# Patient Record
Sex: Male | Born: 1935 | Race: White | Hispanic: No | Smoking: Current some day smoker
Health system: Southern US, Community
[De-identification: ages and names within clinical notes are randomized; demographics above are authoritative.]

## PROBLEM LIST (undated history)

## (undated) DIAGNOSIS — Z95810 Presence of automatic (implantable) cardiac defibrillator: Secondary | ICD-10-CM

## (undated) DIAGNOSIS — I10 Essential (primary) hypertension: Secondary | ICD-10-CM

## (undated) DIAGNOSIS — F329 Major depressive disorder, single episode, unspecified: Secondary | ICD-10-CM

## (undated) DIAGNOSIS — G20A1 Parkinson's disease without dyskinesia, without mention of fluctuations: Secondary | ICD-10-CM

## (undated) DIAGNOSIS — Z87442 Personal history of urinary calculi: Secondary | ICD-10-CM

## (undated) DIAGNOSIS — J449 Chronic obstructive pulmonary disease, unspecified: Secondary | ICD-10-CM

## (undated) DIAGNOSIS — K219 Gastro-esophageal reflux disease without esophagitis: Secondary | ICD-10-CM

## (undated) DIAGNOSIS — I219 Acute myocardial infarction, unspecified: Secondary | ICD-10-CM

## (undated) DIAGNOSIS — R7303 Prediabetes: Secondary | ICD-10-CM

## (undated) DIAGNOSIS — G20 Parkinson's disease: Secondary | ICD-10-CM

## (undated) DIAGNOSIS — F32A Depression, unspecified: Secondary | ICD-10-CM

## (undated) DIAGNOSIS — I499 Cardiac arrhythmia, unspecified: Secondary | ICD-10-CM

## (undated) DIAGNOSIS — I2510 Atherosclerotic heart disease of native coronary artery without angina pectoris: Secondary | ICD-10-CM

## (undated) HISTORY — PX: CORONARY ARTERY BYPASS GRAFT: SHX141

---

## 1993-05-22 HISTORY — PX: OTHER SURGICAL HISTORY: SHX169

## 2005-08-15 ENCOUNTER — Other Ambulatory Visit: Payer: Self-pay

## 2005-08-15 ENCOUNTER — Ambulatory Visit: Payer: Self-pay | Admitting: Urology

## 2005-08-17 ENCOUNTER — Ambulatory Visit: Payer: Self-pay | Admitting: Urology

## 2007-12-24 ENCOUNTER — Ambulatory Visit: Payer: Self-pay | Admitting: Family Medicine

## 2010-03-17 ENCOUNTER — Ambulatory Visit: Payer: Self-pay | Admitting: Urology

## 2010-06-15 ENCOUNTER — Ambulatory Visit: Payer: Self-pay | Admitting: Family Medicine

## 2011-10-17 ENCOUNTER — Ambulatory Visit: Payer: Self-pay | Admitting: Family Medicine

## 2011-12-01 ENCOUNTER — Ambulatory Visit: Payer: Self-pay | Admitting: Emergency Medicine

## 2011-12-04 ENCOUNTER — Ambulatory Visit: Payer: Self-pay

## 2011-12-08 ENCOUNTER — Ambulatory Visit: Payer: Self-pay | Admitting: Emergency Medicine

## 2012-05-22 HISTORY — PX: OTHER SURGICAL HISTORY: SHX169

## 2012-07-01 ENCOUNTER — Ambulatory Visit: Payer: Self-pay

## 2014-08-12 ENCOUNTER — Ambulatory Visit: Payer: Self-pay | Admitting: Urology

## 2014-08-13 ENCOUNTER — Ambulatory Visit: Payer: Self-pay | Admitting: Urology

## 2014-08-20 ENCOUNTER — Ambulatory Visit
Admit: 2014-08-20 | Disposition: A | Discharge status: Home / Self Care | Payer: Self-pay | Attending: Urology | Admitting: Urology

## 2016-05-02 ENCOUNTER — Other Ambulatory Visit: Payer: Self-pay | Admitting: Family Medicine

## 2016-05-02 DIAGNOSIS — R918 Other nonspecific abnormal finding of lung field: Secondary | ICD-10-CM

## 2016-05-10 ENCOUNTER — Ambulatory Visit
Admission: RE | Admit: 2016-05-10 | Discharge: 2016-05-10 | Disposition: A | Discharge status: Home / Self Care | Payer: Medicare Other | Source: Ambulatory Visit | Attending: Family Medicine | Admitting: Family Medicine

## 2016-05-10 DIAGNOSIS — R918 Other nonspecific abnormal finding of lung field: Secondary | ICD-10-CM | POA: Diagnosis present

## 2016-05-10 DIAGNOSIS — R590 Localized enlarged lymph nodes: Secondary | ICD-10-CM | POA: Insufficient documentation

## 2016-05-10 DIAGNOSIS — K769 Liver disease, unspecified: Secondary | ICD-10-CM | POA: Diagnosis not present

## 2016-05-16 ENCOUNTER — Inpatient Hospital Stay: Payer: Medicare Other | Attending: Oncology | Admitting: Oncology

## 2016-05-16 ENCOUNTER — Encounter: Payer: Self-pay | Admitting: Oncology

## 2016-05-16 ENCOUNTER — Encounter (INDEPENDENT_AMBULATORY_CARE_PROVIDER_SITE_OTHER): Payer: Self-pay

## 2016-05-16 VITALS — BP 175/66 | HR 72 | Temp 94.9°F | Resp 18 | Ht 69.0 in | Wt 121.3 lb

## 2016-05-16 DIAGNOSIS — Z950 Presence of cardiac pacemaker: Secondary | ICD-10-CM | POA: Insufficient documentation

## 2016-05-16 DIAGNOSIS — Z7982 Long term (current) use of aspirin: Secondary | ICD-10-CM | POA: Insufficient documentation

## 2016-05-16 DIAGNOSIS — I10 Essential (primary) hypertension: Secondary | ICD-10-CM | POA: Insufficient documentation

## 2016-05-16 DIAGNOSIS — I700 Atherosclerosis of aorta: Secondary | ICD-10-CM | POA: Insufficient documentation

## 2016-05-16 DIAGNOSIS — F17210 Nicotine dependence, cigarettes, uncomplicated: Secondary | ICD-10-CM | POA: Insufficient documentation

## 2016-05-16 DIAGNOSIS — J439 Emphysema, unspecified: Secondary | ICD-10-CM | POA: Insufficient documentation

## 2016-05-16 DIAGNOSIS — Z79899 Other long term (current) drug therapy: Secondary | ICD-10-CM | POA: Insufficient documentation

## 2016-05-16 DIAGNOSIS — R918 Other nonspecific abnormal finding of lung field: Secondary | ICD-10-CM | POA: Diagnosis present

## 2016-05-16 DIAGNOSIS — R599 Enlarged lymph nodes, unspecified: Secondary | ICD-10-CM | POA: Insufficient documentation

## 2016-05-16 NOTE — Progress Notes (Signed)
[place]  [phone] [phone]) [phone]  ID: HARIHARAN KLOOS OB: 03-15-1932  MR#: [id number]  [id number]  Patient Care Team: Sofie Hartigan, MD as PCP - General (Family Medicine)  CHIEF COMPLAINT: Right upper lobe lung mass  INTERVAL HISTORY: Patient is an 82 year old male who was having a persistent cough and increased fatigue unresponsive to antibiotics and steroids. He subsequently had a CT scan that revealed a right upper lobe lung mass highly suspicious for underlying malignancy. Currently patient feels well and is asymptomatic. He has no neurologic complaints. He denies any recent fevers. He does not complain of cough or shortness of breath today. He denies any chest pain. He has no nausea, vomiting, constipation, or diarrhea. He has no urinary complaints. Patient offers no specific complaints today.  REVIEW OF SYSTEMS:   Review of Systems  Constitutional: Negative.  Negative for fever, malaise/fatigue and weight loss.  Respiratory: Negative.  Negative for cough and shortness of breath.   Cardiovascular: Negative.  Negative for chest pain and leg swelling.  Gastrointestinal: Negative.  Negative for abdominal pain.  Genitourinary: Negative.   Musculoskeletal: Negative.   Neurological: Negative.  Negative for weakness.  Psychiatric/Behavioral: Negative.  The patient is not nervous/anxious.     As per HPI. Otherwise, a complete review of systems is negative.  PAST MEDICAL HISTORY: No past medical history on file.  PAST SURGICAL HISTORY: Past Surgical History:  Procedure Laterality Date  . Difibulator placement  2014  . Triple bypass  1995    FAMILY HISTORY: Reviewed and unchanged. No reported history of malignancy or chronic disease.  ADVANCED DIRECTIVES (Y/N):  N  HEALTH MAINTENANCE: Social History  Substance Use Topics  . Smoking status: Current Some Day Smoker    Packs/day: 0.50    Years: 70.00  . Smokeless tobacco: Not on  file  . Alcohol use Not on file     Colonoscopy:  PAP:  Bone density:  Lipid panel:  Not on File  Current Outpatient Prescriptions  Medication Sig Dispense Refill  . albuterol (PROVENTIL HFA;VENTOLIN HFA) 108 (90 Base) MCG/ACT inhaler Inhale into the lungs.    . carbidopa-levodopa (SINEMET IR) 25-100 MG tablet Take 1 tablet by mouth 3 (three) times daily.    . citalopram (CELEXA) 10 MG tablet Take by mouth.    . Clobetasol Prop Emollient Base 0.05 % emollient cream Apply topically 2 (two) times daily.    . fluticasone (FLONASE) 50 MCG/ACT nasal spray Place into both nostrils daily.    . metoprolol succinate (TOPROL-XL) 50 MG 24 hr tablet TAKE 1 TABLET BY MOUTH TWICE DAILY    . montelukast (SINGULAIR) 10 MG tablet TAKE 1 TABLET(10 MG) BY MOUTH EVERY EVENING    . ranitidine (ZANTAC) 150 MG tablet TAKE 1 TABLET(150 MG) BY MOUTH EVERY DAY    . simvastatin (ZOCOR) 20 MG tablet TAKE 1 TABLET(20 MG) BY MOUTH EVERY NIGHT    . aspirin EC 81 MG tablet Take by mouth.    . carboxymethylcellulose (REFRESH PLUS) 0.5 % SOLN Apply to eye.    Marland Kitchen lisinopril (PRINIVIL,ZESTRIL) 5 MG tablet   6   No current facility-administered medications for this visit.     OBJECTIVE: Vitals:   05/16/16 1535  BP: (!) 175/66  Pulse: 72  Resp: 18  Temp: (!) 94.9 F (34.9 C)     Body mass index is 17.91 kg/m.    ECOG FS:1 - Symptomatic but completely ambulatory  General: Well-developed, well-nourished, no acute distress. Eyes: Pink  conjunctiva, anicteric sclera. HEENT: Normocephalic, moist mucous membranes, clear oropharnyx. Lungs: Clear to auscultation bilaterally. Heart: Regular rate and rhythm. No rubs, murmurs, or gallops. Abdomen: Soft, nontender, nondistended. No organomegaly noted, normoactive bowel sounds. Musculoskeletal: No edema, cyanosis, or clubbing. Neuro: Alert, answering all questions appropriately. Cranial nerves grossly intact. Skin: No rashes or petechiae noted. Psych: Normal  affect. Lymphatics: No cervical, calvicular, axillary or inguinal LAD.   LAB RESULTS:  No results found for: NA, K, CL, CO2, GLUCOSE, BUN, CREATININE, CALCIUM, PROT, ALBUMIN, AST, ALT, ALKPHOS, BILITOT, GFRNONAA, GFRAA  No results found for: WBC, NEUTROABS, HGB, HCT, MCV, PLT   STUDIES: Ct Chest Wo Contrast  Result Date: 05/10/2016 CLINICAL DATA:  Pt has no complaints today. NKI. No hx of cancer/surg. FU xray form Dr Ellison Hughs office EXAM: CT CHEST WITHOUT CONTRAST TECHNIQUE: Multidetector CT imaging of the chest was performed following the standard protocol without IV contrast. COMPARISON:  Report only from outside chest x-ray 04/21/2016 FINDINGS: Cardiovascular: Left subclavian pacemaker extends to right ventricular apex. There is diffuse atherosclerosis of the aorta, great vessels and coronary arteries post median sternotomy. The heart size is normal. There is no pericardial effusion. Mediastinum/Nodes: There is a high right paraesophageal node measuring 2.5 x 1.6 cm on image 21. No other enlarged mediastinal, hilar or axillary lymph nodes are seen. Hilar assessment is limited by the lack of intravenous contrast. The thyroid gland, trachea and esophagus demonstrate no significant findings. Lungs/Pleura: There is no pleural effusion. Corresponding with the radiographic report is a dominant right upper lobe nodule which is lobulated, measuring 1.7 x 1.5 cm on image 49. There are several additional pulmonary nodules bilaterally, including a 6 mm right upper lobe nodule on image 50, a 7 mm lingular nodule on image 87, a 9 mm right lower lobe nodule on image 103 and a 7 mm subpleural nodule in the left upper lobe on image 22. There is mild biapical scarring. Mild to moderate underlying emphysema noted. Upper abdomen: There are numerous ill-defined low-density hepatic lesions, highly worrisome for metastatic disease. Largest lesions include a 3.8 x 2.9 cm lesion in the dome of the left lobe (image 143)  and a 2.0 cm lesion posteriorly in the right lobe on image 158. No evidence of adrenal mass. Probable bilateral renal cysts. There is aortic and branch vessel atherosclerosis. Musculoskeletal/Chest wall: There is no chest wall mass or suspicious osseous finding. Paucity of subcutaneous fat. IMPRESSION: 1. Bilateral pulmonary nodules, largest measuring 1.7 cm in the right upper lobe. 2. Multiple ill-defined hepatic lesions, worrisome for metastatic disease. 3. Enlarged high right paraesophageal lymph node, likely a nodal metastasis. 4. Findings are worrisome for stage IV malignancy, possibly from right upper lobe pulmonary primary. Given the prominent hepatic involvement, consider further imaging of the abdomen and pelvis (with contrast) or PET-CT to evaluate for an intraabdominal primary. 5. These results will be called to the ordering clinician or representative by the Radiologist Assistant, and communication documented in the PACS or zVision Dashboard. Electronically Signed   By: Richardean Sale M.D.   On: 05/10/2016 14:56    ASSESSMENT: Right upper lobe lung mass  PLAN:    1. Right upper lobe lung mass: CT scan results from May 10, 2016 reviewed independently and reported as above with bilateral pulmonary nodules including a 1.7 cm right upper lobe lung mass. Given patient's extensive smoking history this is highly suspicious for stage IV malignancy. Will get a PET scan in the next week to further evaluate. This will also  help determine the best place for a biopsy to be done. Patient will return to clinic one to 2 days after his PET scan to discuss the results as well as continued diagnostic planning. 2. Hypertension: Patient's blood pressure is significantly elevated today. Results have been forwarded the patient's cardiologist.  Approximately 45 minutes was spent in discussion of which greater than 50% was consultation.   Patient expressed understanding and was in agreement with this plan. He  also understands that He can call clinic at any time with any questions, concerns, or complaints.   No matching staging information was found for the patient.  Lloyd Huger, MD   05/18/2016 2:01 PM

## 2016-05-16 NOTE — Progress Notes (Signed)
Patient here today as new evaluation regarding lung cancer.  Referred by Dr. Dorann Ou.  Patient states he is having a lot of diarrhea.  No SOB.   Per Dr. Grayland Ormond - notify cardiologist of BP. Faxed VS to Dr. Sabra Heck, [facility]

## 2016-05-23 ENCOUNTER — Encounter
Admission: RE | Admit: 2016-05-23 | Discharge: 2016-05-23 | Disposition: A | Discharge status: Home / Self Care | Payer: Medicare HMO | Source: Ambulatory Visit | Attending: Oncology | Admitting: Oncology

## 2016-05-23 DIAGNOSIS — R918 Other nonspecific abnormal finding of lung field: Secondary | ICD-10-CM

## 2016-05-23 LAB — GLUCOSE, CAPILLARY: GLUCOSE-CAPILLARY: 77 mg/dL (ref 65–99)

## 2016-05-23 MED ORDER — FLUDEOXYGLUCOSE F - 18 (FDG) INJECTION
12.2500 | Freq: Once | INTRAVENOUS | Status: AC | PRN
  Administered 2016-05-23: 12.25 via INTRAVENOUS

## 2016-05-24 DIAGNOSIS — C787 Secondary malignant neoplasm of liver and intrahepatic bile duct: Secondary | ICD-10-CM

## 2016-05-24 DIAGNOSIS — C189 Malignant neoplasm of colon, unspecified: Secondary | ICD-10-CM | POA: Insufficient documentation

## 2016-05-24 NOTE — Progress Notes (Signed)
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  INTERVAL HISTORY: Patient returns to clinic today for further evaluation and discussion of his imaging results. He continues to feel well and is asymptomatic. He has no neurologic complaints. He denies any recent fevers. He does not complain of cough or shortness of breath today. He denies any chest pain. He has no nausea, vomiting, constipation, or diarrhea. He has noted some mucous in his bowel movements, but denies any melena or hematochezia.  He has no urinary complaints. Patient offers no specific complaints today.  REVIEW OF SYSTEMS:   Review of Systems  Constitutional: Negative.  Negative for fever, malaise/fatigue and weight loss.  Respiratory: Negative.  Negative for cough and shortness of breath.   Cardiovascular: Negative.  Negative for chest pain and leg swelling.  Gastrointestinal: Negative.  Negative for abdominal pain.  Genitourinary: Negative.   Musculoskeletal: Negative.   Neurological: Negative.  Negative for weakness.  Psychiatric/Behavioral: Negative.  The patient is not nervous/anxious.     As per HPI. Otherwise, a complete review of systems is negative.  PAST MEDICAL HISTORY: No past medical history on file.  PAST SURGICAL HISTORY: Past Surgical History:  Procedure Laterality Date  . Difibulator placement  2014  . Triple bypass  1995    FAMILY HISTORY: Reviewed and unchanged. No reported history of malignancy or chronic disease.  ADVANCED DIRECTIVES (Y/N):  N  HEALTH MAINTENANCE: Social History  Substance Use Topics  . Smoking status: Current Some Day Smoker    Packs/day: 0.50    Years: 70.00  . Smokeless tobacco: Not on file  . Alcohol use Not on file      Colonoscopy:  PAP:  Bone density:  Lipid panel:  No Known Allergies  Current Outpatient Prescriptions  Medication Sig Dispense Refill  . albuterol (PROVENTIL HFA;VENTOLIN HFA) 108 (90 Base) MCG/ACT inhaler Inhale into the lungs.    Marland Kitchen aspirin EC 81 MG tablet Take by mouth.    . carbidopa-levodopa (SINEMET IR) 25-100 MG tablet Take 1 tablet by mouth 3 (three) times daily.    . carboxymethylcellulose (REFRESH PLUS) 0.5 % SOLN Apply to eye.    . citalopram (CELEXA) 10 MG tablet Take 10 mg by mouth daily.     . Clobetasol Prop Emollient Base 0.05 % emollient cream Apply topically 2 (two) times daily.    . fluticasone (FLONASE) 50 MCG/ACT nasal spray Place into both nostrils daily.    Marland Kitchen lisinopril (PRINIVIL,ZESTRIL) 5 MG tablet   6  . metoprolol succinate (TOPROL-XL) 50 MG 24 hr tablet TAKE 1 TABLET BY MOUTH TWICE DAILY    . montelukast (SINGULAIR) 10 MG tablet TAKE 1 TABLET(10 MG) BY MOUTH EVERY EVENING    . ranitidine (ZANTAC) 150 MG tablet TAKE 1 TABLET(150 MG) BY MOUTH EVERY DAY    . simvastatin (ZOCOR) 20 MG tablet TAKE 1 TABLET(20 MG) BY MOUTH EVERY NIGHT     No current facility-administered medications for this visit.     OBJECTIVE: Vitals:   05/25/16 1431  BP: 130/72  Pulse: 76  Resp: 18  Temp: 97.2 F (36.2 C)     Body mass index is 17.74 kg/m.    ECOG FS:1 - Symptomatic but completely ambulatory  General: Well-developed, well-nourished, no acute distress. Eyes: Pink conjunctiva, anicteric  sclera. Lungs: Clear to auscultation bilaterally. Heart: Regular rate and rhythm. No rubs, murmurs, or gallops. Abdomen: Soft, nontender, nondistended. No organomegaly noted, normoactive bowel sounds. Musculoskeletal: No edema, cyanosis, or clubbing. Neuro: Alert, answering all questions appropriately. Cranial nerves grossly intact. Skin: No rashes or petechiae noted. Psych: Normal affect.   LAB RESULTS:  Lab Results  Component Value Date   NA 140 05/25/2016   K 4.2  05/25/2016   CL 102 05/25/2016   CO2 30 05/25/2016   GLUCOSE 101 (H) 05/25/2016   BUN 26 (H) 05/25/2016   CREATININE 1.19 05/25/2016   CALCIUM 9.7 05/25/2016   PROT 8.1 05/25/2016   ALBUMIN 4.1 05/25/2016   AST 51 (H) 05/25/2016   ALT 32 05/25/2016   ALKPHOS 173 (H) 05/25/2016   BILITOT 0.9 05/25/2016   GFRNONAA 55 (L) 05/25/2016   GFRAA >60 05/25/2016    Lab Results  Component Value Date   WBC 7.8 05/25/2016   NEUTROABS 5.5 05/25/2016   HGB 15.5 05/25/2016   HCT 45.9 05/25/2016   MCV 94.7 05/25/2016   PLT 239 05/25/2016   Lab Results  Component Value Date   CEA 92.1 (H) 05/25/2016   Lab Results  Component Value Date   CA199 598 (H) 05/25/2016     STUDIES: Ct Chest Wo Contrast  Result Date: 05/10/2016 CLINICAL DATA:  Pt has no complaints today. NKI. No hx of cancer/surg. FU xray form Dr Ellison Hughs office EXAM: CT CHEST WITHOUT CONTRAST TECHNIQUE: Multidetector CT imaging of the chest was performed following the standard protocol without IV contrast. COMPARISON:  Report only from outside chest x-ray 04/21/2016 FINDINGS: Cardiovascular: Left subclavian pacemaker extends to right ventricular apex. There is diffuse atherosclerosis of the aorta, great vessels and coronary arteries post median sternotomy. The heart size is normal. There is no pericardial effusion. Mediastinum/Nodes: There is a high right paraesophageal node measuring 2.5 x 1.6 cm on image 21. No other enlarged mediastinal, hilar or axillary lymph nodes are seen. Hilar assessment is limited by the lack of intravenous contrast. The thyroid gland, trachea and esophagus demonstrate no significant findings. Lungs/Pleura: There is no pleural effusion. Corresponding with the radiographic report is a dominant right upper lobe nodule which is lobulated, measuring 1.7 x 1.5 cm on image 49. There are several additional pulmonary nodules bilaterally, including a 6 mm right upper lobe nodule on image 50, a 7 mm lingular nodule  on image 87, a 9 mm right lower lobe nodule on image 103 and a 7 mm subpleural nodule in the left upper lobe on image 22. There is mild biapical scarring. Mild to moderate underlying emphysema noted. Upper abdomen: There are numerous ill-defined low-density hepatic lesions, highly worrisome for metastatic disease. Largest lesions include a 3.8 x 2.9 cm lesion in the dome of the left lobe (image 143) and a 2.0 cm lesion posteriorly in the right lobe on image 158. No evidence of adrenal mass. Probable bilateral renal cysts. There is aortic and branch vessel atherosclerosis. Musculoskeletal/Chest wall: There is no chest wall mass or suspicious osseous finding. Paucity of subcutaneous fat. IMPRESSION: 1. Bilateral pulmonary nodules, largest measuring 1.7 cm in the right upper lobe. 2. Multiple ill-defined hepatic lesions, worrisome for metastatic disease. 3. Enlarged high right paraesophageal lymph node, likely a nodal metastasis. 4. Findings are worrisome for stage IV malignancy, possibly from right upper lobe pulmonary primary. Given the prominent hepatic involvement, consider further imaging of the abdomen and pelvis (with contrast) or PET-CT to evaluate for an intraabdominal primary. 5.  These results will be called to the ordering clinician or representative by the Radiologist Assistant, and communication documented in the PACS or zVision Dashboard. Electronically Signed   By: Richardean Sale M.D.   On: 05/10/2016 14:56   Nm Pet Image Initial (pi) Skull Base To Thigh  Result Date: 05/23/2016 CLINICAL DATA:  Initial treatment strategy for right upper lobe lung mass. EXAM: NUCLEAR MEDICINE PET SKULL BASE TO THIGH TECHNIQUE: 12.25 mCi F-18 FDG was injected intravenously. Full-ring PET imaging was performed from the skull base to thigh after the radiotracer. CT data was obtained and used for attenuation correction and anatomic localization. FASTING BLOOD GLUCOSE:  Value: 77 mg/dl COMPARISON:  Chest CT 05/10/2016  FINDINGS: NECK No hypermetabolic lymph nodes in the neck. CHEST Multiple pulmonary nodules are again demonstrated. The largest lesion in the right upper lobe on image number 83 is hypermetabolic with SUV max of 4.3. A smaller right upper lobe lesion on the same image number measures 6.5 mm and is weakly hypermetabolic with SUV max of 1.3. Left upper lobe lesion measuring 9 mm on image number 108 is weakly hypermetabolic with SUV max of 1.8. 6 mm right lower lobe nodule in the as azygoesophageal recess is hypermetabolic with SUV max 4. There is a high right paraesophageal lesion which is not hypermetabolic. 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 No enlarged or hypermetabolic mediastinal or hilar lymph nodes. ABDOMEN/PELVIS Numerous hypermetabolic hepatic metastatic lesions. The largest lesion is in segment 2 and measures 3 cm. SUV max is 13.0. There is a markedly hypermetabolic sigmoid colon lesion with SUV max of 14.7. There appears to be direct invasion of the sigmoid mesocolon extending up into the retroperitoneum between the iliac arteries. No pelvic sidewall or inguinal adenopathy. SKELETON No focal hypermetabolic activity to suggest skeletal metastasis. IMPRESSION: 1. PET-CT findings most consistent with sigmoid colon neoplasm with hepatic and pulmonary metastatic disease. There also appears to be direct invasion of the sigmoid mesocolon extending up into the lower retroperitoneum. 2. 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 No hypermetabolism to suggest neoplasm. 3. Emphysematous changes and pulmonary scarring. Electronically Signed   By: Marijo Sanes M.D.   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 Both CEA and CA-19-9 are significantly elevated. Have ordered ultrasound-guided biopsy of PET positive liver mass for further evaluation. Patient will return to clinic one week after the biopsy to discuss the results and treatment planning.  2. Hypertension: Patient's blood pressure is within normal limits today. Monitor.   Approximately 30 minutes was spent in discussion of which greater than 50% was consultation.  Patient expressed understanding and was in agreement with this plan. He also understands that He can call clinic at any time with any questions, concerns, or complaints.   No matching staging information was found for the patient.  Lloyd Huger, MD   05/26/2016 1:04 PM

## 2016-05-25 ENCOUNTER — Other Ambulatory Visit: Payer: Self-pay

## 2016-05-25 ENCOUNTER — Inpatient Hospital Stay: Payer: Medicare HMO | Attending: Oncology | Admitting: Oncology

## 2016-05-25 ENCOUNTER — Inpatient Hospital Stay: Payer: Medicare HMO

## 2016-05-25 VITALS — BP 130/72 | HR 76 | Temp 97.2°F | Resp 18 | Wt 120.2 lb

## 2016-05-25 DIAGNOSIS — C189 Malignant neoplasm of colon, unspecified: Secondary | ICD-10-CM

## 2016-05-25 DIAGNOSIS — I10 Essential (primary) hypertension: Secondary | ICD-10-CM | POA: Diagnosis not present

## 2016-05-25 DIAGNOSIS — C787 Secondary malignant neoplasm of liver and intrahepatic bile duct: Principal | ICD-10-CM

## 2016-05-25 DIAGNOSIS — C187 Malignant neoplasm of sigmoid colon: Secondary | ICD-10-CM | POA: Diagnosis not present

## 2016-05-25 DIAGNOSIS — C7800 Secondary malignant neoplasm of unspecified lung: Secondary | ICD-10-CM | POA: Diagnosis not present

## 2016-05-25 DIAGNOSIS — F17210 Nicotine dependence, cigarettes, uncomplicated: Secondary | ICD-10-CM | POA: Insufficient documentation

## 2016-05-25 DIAGNOSIS — Z79899 Other long term (current) drug therapy: Secondary | ICD-10-CM | POA: Diagnosis not present

## 2016-05-25 LAB — COMPREHENSIVE METABOLIC PANEL
ALK PHOS: 173 U/L — AB (ref 38–126)
ALT: 32 U/L (ref 17–63)
AST: 51 U/L — ABNORMAL HIGH (ref 15–41)
Albumin: 4.1 g/dL (ref 3.5–5.0)
Anion gap: 8 (ref 5–15)
BILIRUBIN TOTAL: 0.9 mg/dL (ref 0.3–1.2)
BUN: 26 mg/dL — ABNORMAL HIGH (ref 6–20)
CALCIUM: 9.7 mg/dL (ref 8.9–10.3)
CO2: 30 mmol/L (ref 22–32)
CREATININE: 1.19 mg/dL (ref 0.61–1.24)
Chloride: 102 mmol/L (ref 101–111)
GFR calc non Af Amer: 55 mL/min — ABNORMAL LOW (ref >60)
Glucose, Bld: 101 mg/dL — ABNORMAL HIGH (ref 65–99)
Potassium: 4.2 mmol/L (ref 3.5–5.1)
Sodium: 140 mmol/L (ref 135–145)
TOTAL PROTEIN: 8.1 g/dL (ref 6.5–8.1)

## 2016-05-25 LAB — CBC WITH DIFFERENTIAL/PLATELET
Basophils Absolute: 0 10*3/uL (ref 0–0.1)
Basophils Relative: 0 %
EOS PCT: 3 %
Eosinophils Absolute: 0.2 10*3/uL (ref 0–0.7)
HCT: 45.9 % (ref 40.0–52.0)
HEMOGLOBIN: 15.5 g/dL (ref 13.0–18.0)
LYMPHS ABS: 1.4 10*3/uL (ref 1.0–3.6)
Lymphocytes Relative: 17 %
MCH: 31.9 pg (ref 26.0–34.0)
MCHC: 33.7 g/dL (ref 32.0–36.0)
MCV: 94.7 fL (ref 80.0–100.0)
MONOS PCT: 9 %
Monocytes Absolute: 0.7 10*3/uL (ref 0.2–1.0)
NEUTROS PCT: 71 %
Neutro Abs: 5.5 10*3/uL (ref 1.4–6.5)
Platelets: 239 10*3/uL (ref 150–440)
RBC: 4.85 MIL/uL (ref 4.40–5.90)
RDW: 15.6 % — ABNORMAL HIGH (ref 11.5–14.5)
WBC: 7.8 10*3/uL (ref 3.8–10.6)

## 2016-05-25 NOTE — Progress Notes (Signed)
Offers no complaints. Feeling well. 

## 2016-05-26 LAB — CEA: CEA: 92.1 ng/mL — ABNORMAL HIGH (ref 0.0–4.7)

## 2016-05-26 LAB — CANCER ANTIGEN 19-9: CA 19-9: 598 U/mL — ABNORMAL HIGH (ref 0–35)

## 2016-05-30 ENCOUNTER — Ambulatory Visit
Admission: RE | Admit: 2016-05-30 | Discharge: 2016-05-30 | Disposition: A | Discharge status: Home / Self Care | Payer: Medicare HMO | Source: Ambulatory Visit | Attending: Oncology | Admitting: Oncology

## 2016-05-30 ENCOUNTER — Other Ambulatory Visit: Payer: Self-pay | Admitting: *Deleted

## 2016-05-30 ENCOUNTER — Telehealth: Payer: Self-pay | Admitting: *Deleted

## 2016-05-30 DIAGNOSIS — I4891 Unspecified atrial fibrillation: Secondary | ICD-10-CM

## 2016-05-30 DIAGNOSIS — I10 Essential (primary) hypertension: Secondary | ICD-10-CM | POA: Diagnosis not present

## 2016-05-30 DIAGNOSIS — I2510 Atherosclerotic heart disease of native coronary artery without angina pectoris: Secondary | ICD-10-CM | POA: Diagnosis not present

## 2016-05-30 DIAGNOSIS — R7303 Prediabetes: Secondary | ICD-10-CM | POA: Insufficient documentation

## 2016-05-30 DIAGNOSIS — C787 Secondary malignant neoplasm of liver and intrahepatic bile duct: Principal | ICD-10-CM

## 2016-05-30 DIAGNOSIS — I499 Cardiac arrhythmia, unspecified: Secondary | ICD-10-CM | POA: Insufficient documentation

## 2016-05-30 DIAGNOSIS — I4510 Unspecified right bundle-branch block: Secondary | ICD-10-CM | POA: Diagnosis not present

## 2016-05-30 DIAGNOSIS — Z01812 Encounter for preprocedural laboratory examination: Secondary | ICD-10-CM | POA: Insufficient documentation

## 2016-05-30 DIAGNOSIS — Z95810 Presence of automatic (implantable) cardiac defibrillator: Secondary | ICD-10-CM | POA: Insufficient documentation

## 2016-05-30 DIAGNOSIS — Z01810 Encounter for preprocedural cardiovascular examination: Secondary | ICD-10-CM | POA: Diagnosis present

## 2016-05-30 DIAGNOSIS — G20 Parkinson's disease: Secondary | ICD-10-CM | POA: Diagnosis not present

## 2016-05-30 DIAGNOSIS — I252 Old myocardial infarction: Secondary | ICD-10-CM | POA: Diagnosis not present

## 2016-05-30 DIAGNOSIS — J449 Chronic obstructive pulmonary disease, unspecified: Secondary | ICD-10-CM | POA: Diagnosis not present

## 2016-05-30 DIAGNOSIS — C189 Malignant neoplasm of colon, unspecified: Secondary | ICD-10-CM

## 2016-05-30 HISTORY — DX: Atherosclerotic heart disease of native coronary artery without angina pectoris: I25.10

## 2016-05-30 HISTORY — DX: Cardiac arrhythmia, unspecified: I49.9

## 2016-05-30 HISTORY — DX: Depression, unspecified: F32.A

## 2016-05-30 HISTORY — DX: Prediabetes: R73.03

## 2016-05-30 HISTORY — DX: Gastro-esophageal reflux disease without esophagitis: K21.9

## 2016-05-30 HISTORY — DX: Personal history of urinary calculi: Z87.442

## 2016-05-30 HISTORY — DX: Essential (primary) hypertension: I10

## 2016-05-30 HISTORY — DX: Parkinson's disease: G20

## 2016-05-30 HISTORY — DX: Presence of automatic (implantable) cardiac defibrillator: Z95.810

## 2016-05-30 HISTORY — DX: Chronic obstructive pulmonary disease, unspecified: J44.9

## 2016-05-30 HISTORY — DX: Acute myocardial infarction, unspecified: I21.9

## 2016-05-30 HISTORY — DX: Parkinson's disease without dyskinesia, without mention of fluctuations: G20.A1

## 2016-05-30 HISTORY — DX: Major depressive disorder, single episode, unspecified: F32.9

## 2016-05-30 LAB — PROTIME-INR
INR: 1.05
PROTHROMBIN TIME: 13.7 s (ref 11.4–15.2)

## 2016-05-30 LAB — APTT: APTT: 30 s (ref 24–36)

## 2016-05-30 MED ORDER — SODIUM CHLORIDE 0.9 % IV SOLN: 500.0000 mL | INTRAVENOUS | Status: DC

## 2016-05-30 NOTE — Telephone Encounter (Signed)
Procedure cancelled due to patient being in Afib and having PVC's he needs a cardiology consult and his meds adjusted before they "stick his liver"

## 2016-05-30 NOTE — OR Nursing (Signed)
Dr Barbie Banner notified of arrhythmia, new onset atrial fib with frequent multifocal PVC. Left message with Dr Maryjane Hurter and cancer center triage nurse that patient and son informed that they need to see cardiologist prior to liver biopsy. Pt discharged home

## 2016-05-30 NOTE — Consult Note (Signed)
Chief Complaint: Patient was seen in consultation today for No chief complaint on file.  at the request of Finnegan,Timothy J  Referring Physician(s): Finnegan,Timothy J  Supervising Physician: Marybelle Killings  Patient Status: [facility] - Out-pt  History of Present Illness: TIVIS DESANCTIS is a 83 y.o. male who presented with a cough and was found to have lung nodules, liver lesions, and a sigmoid colon mass. He had an MI three years ago, but is asymptomatic. He denies any other symptoms.  No past medical history on file.  MI CAD Parkinsonism High cholesterol COPD HTN  Past Surgical History:  Procedure Laterality Date  . Difibulator placement  2014  . Triple bypass  1995    Allergies: Patient has no known allergies.  Medications: Prior to Admission medications   Medication Sig Start Date End Date Taking? Authorizing Provider  albuterol (PROVENTIL HFA;VENTOLIN HFA) 108 (90 Base) MCG/ACT inhaler Inhale into the lungs. 04/21/16 04/21/17  Historical Provider, MD  aspirin EC 81 MG tablet Take by mouth.    Historical Provider, MD  carbidopa-levodopa (SINEMET IR) 25-100 MG tablet Take 1 tablet by mouth 3 (three) times daily.    Historical Provider, MD  carboxymethylcellulose (REFRESH PLUS) 0.5 % SOLN Apply to eye.    Historical Provider, MD  citalopram (CELEXA) 10 MG tablet Take 10 mg by mouth daily.  02/25/15   Historical Provider, MD  Clobetasol Prop Emollient Base 0.05 % emollient cream Apply topically 2 (two) times daily.    Historical Provider, MD  fluticasone (FLONASE) 50 MCG/ACT nasal spray Place into both nostrils daily.    Historical Provider, MD  lisinopril (PRINIVIL,ZESTRIL) 5 MG tablet  03/01/16   Historical Provider, MD  metoprolol succinate (TOPROL-XL) 50 MG 24 hr tablet TAKE 1 TABLET BY MOUTH TWICE DAILY 04/13/15   Historical Provider, MD  montelukast (SINGULAIR) 10 MG tablet TAKE 1 TABLET(10 MG) BY MOUTH EVERY EVENING 08/05/15   Historical Provider, MD  ranitidine  (ZANTAC) 150 MG tablet TAKE 1 TABLET(150 MG) BY MOUTH EVERY DAY 01/12/16   Historical Provider, MD  simvastatin (ZOCOR) 20 MG tablet TAKE 1 TABLET(20 MG) BY MOUTH EVERY NIGHT 12/03/15   Historical Provider, MD     No family history on file.  Social History   Social History  . Marital status: Married    Spouse name: N/A  . Number of children: N/A  . Years of education: N/A   Social History Main Topics  . Smoking status: Current Some Day Smoker    Packs/day: 0.50    Years: 70.00  . Smokeless tobacco: Not on file  . Alcohol use Not on file  . Drug use: Unknown  . Sexual activity: Not on file   Other Topics Concern  . Not on file   Social History Narrative  . No narrative on file     Review of Systems: A 12 point ROS discussed and pertinent positives are indicated in the HPI above.  All other systems are negative.  Review of Systems  Vital Signs: There were no vitals taken for this visit.  Physical Exam  Constitutional: He is oriented to person, place, and time. He appears well-developed and well-nourished.  HENT:  Head: Normocephalic and atraumatic.  Cardiovascular: Normal rate and regular rhythm.   Pulmonary/Chest: Effort normal and breath sounds normal.  Neurological: He is alert and oriented to person, place, and time.    Mallampati Score: 1    Imaging: Ct Chest Wo Contrast  Result Date: 05/10/2016 CLINICAL DATA:  Pt has no complaints today. NKI. No hx of cancer/surg. FU xray form Dr Ellison Hughs office EXAM: CT CHEST WITHOUT CONTRAST TECHNIQUE: Multidetector CT imaging of the chest was performed following the standard protocol without IV contrast. COMPARISON:  Report only from outside chest x-ray 04/21/2016 FINDINGS: Cardiovascular: Left subclavian pacemaker extends to right ventricular apex. There is diffuse atherosclerosis of the aorta, great vessels and coronary arteries post median sternotomy. The heart size is normal. There is no pericardial effusion.  Mediastinum/Nodes: There is a high right paraesophageal node measuring 2.5 x 1.6 cm on image 21. No other enlarged mediastinal, hilar or axillary lymph nodes are seen. Hilar assessment is limited by the lack of intravenous contrast. The thyroid gland, trachea and esophagus demonstrate no significant findings. Lungs/Pleura: There is no pleural effusion. Corresponding with the radiographic report is a dominant right upper lobe nodule which is lobulated, measuring 1.7 x 1.5 cm on image 49. There are several additional pulmonary nodules bilaterally, including a 6 mm right upper lobe nodule on image 50, a 7 mm lingular nodule on image 87, a 9 mm right lower lobe nodule on image 103 and a 7 mm subpleural nodule in the left upper lobe on image 22. There is mild biapical scarring. Mild to moderate underlying emphysema noted. Upper abdomen: There are numerous ill-defined low-density hepatic lesions, highly worrisome for metastatic disease. Largest lesions include a 3.8 x 2.9 cm lesion in the dome of the left lobe (image 143) and a 2.0 cm lesion posteriorly in the right lobe on image 158. No evidence of adrenal mass. Probable bilateral renal cysts. There is aortic and branch vessel atherosclerosis. Musculoskeletal/Chest wall: There is no chest wall mass or suspicious osseous finding. Paucity of subcutaneous fat. IMPRESSION: 1. Bilateral pulmonary nodules, largest measuring 1.7 cm in the right upper lobe. 2. Multiple ill-defined hepatic lesions, worrisome for metastatic disease. 3. Enlarged high right paraesophageal lymph node, likely a nodal metastasis. 4. Findings are worrisome for stage IV malignancy, possibly from right upper lobe pulmonary primary. Given the prominent hepatic involvement, consider further imaging of the abdomen and pelvis (with contrast) or PET-CT to evaluate for an intraabdominal primary. 5. These results will be called to the ordering clinician or representative by the Radiologist Assistant, and  communication documented in the PACS or zVision Dashboard. Electronically Signed   By: Richardean Sale M.D.   On: 05/10/2016 14:56   Nm Pet Image Initial (pi) Skull Base To Thigh  Result Date: 05/23/2016 CLINICAL DATA:  Initial treatment strategy for right upper lobe lung mass. EXAM: NUCLEAR MEDICINE PET SKULL BASE TO THIGH TECHNIQUE: 12.25 mCi F-18 FDG was injected intravenously. Full-ring PET imaging was performed from the skull base to thigh after the radiotracer. CT data was obtained and used for attenuation correction and anatomic localization. FASTING BLOOD GLUCOSE:  Value: 77 mg/dl COMPARISON:  Chest CT 05/10/2016 FINDINGS: NECK No hypermetabolic lymph nodes in the neck. CHEST Multiple pulmonary nodules are again demonstrated. The largest lesion in the right upper lobe on image number 83 is hypermetabolic with SUV max of 4.3. A smaller right upper lobe lesion on the same image number measures 6.5 mm and is weakly hypermetabolic with SUV max of 1.3. Left upper lobe lesion measuring 9 mm on image number 108 is weakly hypermetabolic with SUV max of 1.8. 6 mm right lower lobe nodule in the as azygoesophageal recess is hypermetabolic with SUV max 4. There is a high right paraesophageal lesion which is not hypermetabolic. 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 No enlarged or hypermetabolic mediastinal or hilar lymph nodes. ABDOMEN/PELVIS Numerous hypermetabolic hepatic metastatic lesions. The largest lesion is in segment 2 and measures 3 cm. SUV max is 13.0. There is a markedly hypermetabolic sigmoid colon lesion with SUV max of 14.7. There appears to be direct invasion of the sigmoid mesocolon extending up into the retroperitoneum between the iliac arteries. No pelvic sidewall or inguinal adenopathy. SKELETON No focal hypermetabolic activity to suggest skeletal metastasis. IMPRESSION: 1. PET-CT findings most consistent with sigmoid colon neoplasm with hepatic  and pulmonary metastatic disease. There also appears to be direct invasion of the sigmoid mesocolon extending up into the lower retroperitoneum. 2. 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 No hypermetabolism to suggest neoplasm. 3. Emphysematous changes and pulmonary scarring. Electronically Signed   By: Marijo Sanes M.D.   On: 05/23/2016 11:09    Labs:  CBC:  Recent Labs  05/25/16 1500  WBC 7.8  HGB 15.5  HCT 45.9  PLT 239    COAGS: No results for input(s): INR, APTT in the last 8760 hours.  BMP:  Recent Labs  05/25/16 1500  NA 140  K 4.2  CL 102  CO2 30  GLUCOSE 101*  BUN 26*  CALCIUM 9.7  CREATININE 1.19  GFRNONAA 55*  GFRAA >60    LIVER FUNCTION TESTS:  Recent Labs  05/25/16 1500  BILITOT 0.9  AST 51*  ALT 32  ALKPHOS 173*  PROT 8.1  ALBUMIN 4.1    TUMOR MARKERS:  Recent Labs  05/25/16 1500  CEA 92.1*  CA199 598*    Assessment and Plan:  Liver lesions. US guided liver biopsy to follow.  Thank you for this interesting consult.  I greatly enjoyed meeting THANOS CAPAN and look forward to participating in their care.  A copy of this report was sent to the requesting provider on this date.  Electronically Signed: Binnie Droessler, ART A 05/30/2016, 8:29 AM   I spent a total of  40 Minutes   in face to face in clinical consultation, greater than 50% of which was counseling/coordinating care for liver biopsy.

## 2016-05-30 NOTE — Progress Notes (Signed)
Patient ID: Elijah Chambers, male   DOB: [birth year], 83 y.o.   MRN: [id number] During the pre op procedure for his live biopsy, Mr. Steeb rhythm became irregular. He was asymptomatic with stable vital signs. A twelve lead EKG revealed atrial fibrillation with multiple PVCs. He does have  ahistory of V fib with na AICD, but there is no history of atrial fibrillation. His last cardiology visit was in 2016. His biopsy was postponed pending workup for this new arrhythmia.

## 2016-05-30 NOTE — Telephone Encounter (Signed)
MD aware. Referral to cardiology will be made and biopsy to be rescheduled after seeing cardiology.

## 2016-05-31 ENCOUNTER — Ambulatory Visit: Payer: Medicare HMO | Admitting: Oncology

## 2016-06-02 ENCOUNTER — Ambulatory Visit: Payer: Medicare HMO | Admitting: Oncology

## 2016-06-12 ENCOUNTER — Other Ambulatory Visit: Payer: Self-pay | Admitting: *Deleted

## 2016-06-12 DIAGNOSIS — C787 Secondary malignant neoplasm of liver and intrahepatic bile duct: Principal | ICD-10-CM

## 2016-06-12 DIAGNOSIS — C189 Malignant neoplasm of colon, unspecified: Secondary | ICD-10-CM

## 2016-06-20 ENCOUNTER — Other Ambulatory Visit: Payer: Self-pay | Admitting: General Surgery

## 2016-06-21 ENCOUNTER — Ambulatory Visit
Admission: RE | Admit: 2016-06-21 | Discharge: 2016-06-21 | Disposition: A | Discharge status: Home / Self Care | Payer: Medicare HMO | Source: Ambulatory Visit | Attending: Oncology | Admitting: Oncology

## 2016-06-21 DIAGNOSIS — C189 Malignant neoplasm of colon, unspecified: Secondary | ICD-10-CM | POA: Diagnosis present

## 2016-06-21 DIAGNOSIS — C787 Secondary malignant neoplasm of liver and intrahepatic bile duct: Secondary | ICD-10-CM | POA: Insufficient documentation

## 2016-06-21 LAB — PROTIME-INR
INR: 1.15
PROTHROMBIN TIME: 14.8 s (ref 11.4–15.2)

## 2016-06-21 LAB — CBC
HCT: 41.7 % (ref 40.0–52.0)
Hemoglobin: 14.1 g/dL (ref 13.0–18.0)
MCH: 32.3 pg (ref 26.0–34.0)
MCHC: 33.9 g/dL (ref 32.0–36.0)
MCV: 95.5 fL (ref 80.0–100.0)
PLATELETS: 182 10*3/uL (ref 150–440)
RBC: 4.37 MIL/uL — AB (ref 4.40–5.90)
RDW: 16.1 % — ABNORMAL HIGH (ref 11.5–14.5)
WBC: 7.6 10*3/uL (ref 3.8–10.6)

## 2016-06-21 LAB — APTT: aPTT: 34 seconds (ref 24–36)

## 2016-06-21 MED ORDER — LABETALOL HCL 5 MG/ML IV SOLN
INTRAVENOUS | Status: AC
  Administered 2016-06-21: 5 mg
  Filled 2016-06-21: qty 4

## 2016-06-21 MED ORDER — METOPROLOL SUCCINATE ER 50 MG PO TB24
50.0000 mg | ORAL_TABLET | Freq: Every day | ORAL | Status: DC
  Administered 2016-06-21: 50 mg via ORAL
  Filled 2016-06-21: qty 1

## 2016-06-21 MED ORDER — LABETALOL HCL 5 MG/ML IV SOLN
5.0000 mg | INTRAVENOUS | Status: DC | PRN
Start: 2016-06-21 — End: 2016-06-22
  Administered 2016-06-21 (×2): 5 mg via INTRAVENOUS
  Filled 2016-06-21 (×3): qty 4

## 2016-06-21 MED ORDER — SODIUM CHLORIDE 0.9 % IV SOLN
INTRAVENOUS | Status: DC
  Administered 2016-06-21: 08:00:00 via INTRAVENOUS

## 2016-06-21 MED ORDER — FENTANYL CITRATE (PF) 100 MCG/2ML IJ SOLN
INTRAMUSCULAR | Status: AC | PRN
  Administered 2016-06-21: 50 ug via INTRAVENOUS

## 2016-06-21 MED ORDER — MIDAZOLAM HCL 5 MG/5ML IJ SOLN
INTRAMUSCULAR | Status: AC | PRN
  Administered 2016-06-21 (×2): 1 mg via INTRAVENOUS

## 2016-06-21 MED ORDER — METOPROLOL SUCCINATE ER 50 MG PO TB24
ORAL_TABLET | ORAL | Status: AC
  Administered 2016-06-21: 50 mg via ORAL
  Filled 2016-06-21: qty 1

## 2016-06-21 NOTE — OR Nursing (Signed)
Dr Vernard Gambles informed of bp increasing. Metoprolol (home med) given.

## 2016-06-21 NOTE — Procedures (Signed)
US liver lesion core bx 18g x3 to surg path No complication No blood loss. See complete dictation in [facility] PACS.  

## 2016-06-22 ENCOUNTER — Other Ambulatory Visit: Payer: Self-pay | Admitting: Anatomic Pathology & Clinical Pathology

## 2016-06-28 ENCOUNTER — Ambulatory Visit: Payer: Medicare HMO | Admitting: Oncology

## 2016-06-28 NOTE — Progress Notes (Signed)
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  INTERVAL HISTORY: Patient returns to clinic today for further evaluation, discussion of his biopsy results, and treatment planning. He continues to feel well and is asymptomatic. He has no neurologic complaints. He denies any recent fevers. He does not complain of cough or shortness of breath today. He denies any chest pain. He has no nausea, vomiting, constipation, or diarrhea. He has noted some mucous in his bowel movements, but denies any melena or hematochezia. He denies any abdominal pain. He has no urinary complaints. Patient offers no specific complaints today.  REVIEW OF SYSTEMS:   Review of Systems  Constitutional: Negative.  Negative for fever, malaise/fatigue and weight loss.  Respiratory: Negative.  Negative for cough and shortness of breath.   Cardiovascular: Negative.  Negative for chest pain and leg swelling.  Gastrointestinal: Negative.  Negative for abdominal pain.  Genitourinary: Negative.   Musculoskeletal: Negative.   Neurological: Negative.  Negative for weakness.  Psychiatric/Behavioral: Negative.  The patient is not nervous/anxious.     As per HPI. Otherwise, a complete review of systems is negative.  PAST MEDICAL HISTORY: Past Medical History:  Diagnosis Date  . AICD (automatic cardioverter/defibrillator) present    2016  . COPD (chronic obstructive pulmonary disease) (Home)   . Coronary artery disease   . Depression   . GERD (gastroesophageal reflux disease)   . History of kidney stones   . Hypertension   . Myocardial infarction   . Parkinson disease ([place])   . Pre-diabetes   . vent tach    icd 2016    PAST SURGICAL  HISTORY: Past Surgical History:  Procedure Laterality Date  . CORONARY ARTERY BYPASS GRAFT    . Difibulator placement  2014  . Triple bypass  1995    FAMILY HISTORY: Reviewed and unchanged. No reported history of malignancy or chronic disease.  ADVANCED DIRECTIVES (Y/N):  N  HEALTH MAINTENANCE: Social History  Substance Use Topics  . Smoking status: Current Some Day Smoker    Packs/day: 0.50    Years: 70.00  . Smokeless tobacco: Current User    Types: Chew  . Alcohol use Yes     Comment: just a little     Colonoscopy:  PAP:  Bone density:  Lipid panel:  No Known Allergies  Current Outpatient Prescriptions  Medication Sig Dispense Refill  . albuterol (PROVENTIL HFA;VENTOLIN HFA) 108 (90 Base) MCG/ACT inhaler Inhale into the lungs.    Marland Kitchen aspirin EC 81 MG tablet Take by mouth.    . carbidopa-levodopa (SINEMET IR) 25-100 MG tablet Take 1 tablet by mouth 3 (three) times daily.    . carboxymethylcellulose (REFRESH PLUS) 0.5 % SOLN Apply to eye.    . citalopram (CELEXA) 10 MG tablet Take 10 mg by mouth daily.     . Clobetasol Prop Emollient Base 0.05 % emollient cream Apply topically 2 (two) times daily.    Marland Kitchen lisinopril (PRINIVIL,ZESTRIL) 5 MG tablet   6  . metoprolol succinate (TOPROL-XL) 50 MG 24 hr tablet TAKE 1 TABLET BY MOUTH TWICE DAILY    . ranitidine (ZANTAC) 150 MG tablet TAKE 1 TABLET(150 MG) BY MOUTH EVERY DAY    . simvastatin (ZOCOR) 20 MG tablet TAKE 1 TABLET(20  MG) BY MOUTH EVERY NIGHT    . fluticasone (FLONASE) 50 MCG/ACT nasal spray Place into both nostrils daily.    . montelukast (SINGULAIR) 10 MG tablet TAKE 1 TABLET(10 MG) BY MOUTH EVERY EVENING     No current facility-administered medications for this visit.     OBJECTIVE: Vitals:   06/30/16 1141  BP: (!) 164/95  Pulse: (!) 55  Temp: (!) 96.3 F (35.7 C)     Body mass index is 19.21 kg/m.    ECOG FS:1 - Symptomatic but completely ambulatory  General: Well-developed, well-nourished, no acute  distress. Eyes: Pink conjunctiva, anicteric sclera. Lungs: Clear to auscultation bilaterally. Heart: Regular rate and rhythm. No rubs, murmurs, or gallops. Abdomen: Soft, nontender, nondistended. No organomegaly noted, normoactive bowel sounds. Musculoskeletal: No edema, cyanosis, or clubbing. Neuro: Alert, answering all questions appropriately. Cranial nerves grossly intact. Skin: No rashes or petechiae noted. Psych: Normal affect.   LAB RESULTS:  Lab Results  Component Value Date   NA 140 05/25/2016   K 4.2 05/25/2016   CL 102 05/25/2016   CO2 30 05/25/2016   GLUCOSE 101 (H) 05/25/2016   BUN 26 (H) 05/25/2016   CREATININE 1.19 05/25/2016   CALCIUM 9.7 05/25/2016   PROT 8.1 05/25/2016   ALBUMIN 4.1 05/25/2016   AST 51 (H) 05/25/2016   ALT 32 05/25/2016   ALKPHOS 173 (H) 05/25/2016   BILITOT 0.9 05/25/2016   GFRNONAA 55 (L) 05/25/2016   GFRAA >60 05/25/2016    Lab Results  Component Value Date   WBC 7.6 06/24/16   NEUTROABS 5.5 05/25/2016   HGB 14.1 June 24, 2016   HCT 41.7 06/24/2016   MCV 95.5 June 24, 2016   PLT 182 24-Jun-2016   Lab Results  Component Value Date   CEA 92.1 (H) 05/25/2016   Lab Results  Component Value Date   CA199 598 (H) 05/25/2016     STUDIES: US Biopsy  Result Date: 2016/06/24 CLINICAL DATA:  Colon carcinoma. Hepatic and pulmonary metastatic disease on PET-CT. EXAM: ULTRASOUND GUIDED CORE BIOPSY OF LIVER LESION MEDICATIONS: Intravenous Fentanyl and Versed were administered as conscious sedation during continuous monitoring of the patient's level of consciousness and physiological / cardiorespiratory status by the radiology RN, with a total moderate sedation time of 10 minutes. PROCEDURE: The procedure, risks, benefits, and alternatives were explained to the patient. Questions regarding the procedure were encouraged and answered. The patient understands and consents to the procedure. Survey ultrasound of the liver was performed. A  representative lesion in the right lobe was localized and appropriate skin entry site determined and marked. The operative field was prepped with chlorhexidine in a sterile fashion, and a sterile drape was applied covering the operative field. A sterile gown and sterile gloves were used for the procedure. Local anesthesia was provided with 1% Lidocaine. Under real-time ultrasound guidance, a 17 gauge trocar needle was advanced to the margin of the lesion. Once needle tip position was confirmed, coaxial 18-gauge core biopsy samples were obtained, submitted in formalin to surgical pathology. The guide needle was removed. Postprocedure scans show no hemorrhage or other apparent complication. The patient tolerated the procedure well. COMPLICATIONS: None. FINDINGS: Multiple liver lesions were identified corresponding to findings on PET-CT. Representative ultrasound-guided core biopsy obtained as above. IMPRESSION: 1. Technically successful core biopsy of liver lesion. Electronically Signed   By: Lucrezia Europe M.D.   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 Metastatic sigmoid colon cancer  to liver and lungs: Ultrasound-guided liver biopsy confirms metastatic colon cancer. PET scan results reviewed independently and reported as above with widespread metastatic disease in liver and lungs. Both CEA and CA-19-9 are significantly elevated. After lengthy discussion with the patient and his family, he wishes to pursue palliative chemotherapy using FOLFOX. K-ras results are pending. Will not use Avastin initially pending any possible surgical intervention. Patient will require port prior to initiating treatment. Return to clinic in 1 week for laboratory work and further evaluation. Patient will then initiate treatment on Monday, July 10, 2016. 2. Hypertension: Patient's blood pressure is within normal limits today. Monitor.   Approximately 30 minutes was spent  in discussion of which greater than 50% was consultation.  Patient expressed understanding and was in agreement with this plan. He also understands that He can call clinic at any time with any questions, concerns, or complaints.   Cancer Staging Metastatic colon cancer to liver [facility]) Staging form: Colon and Rectum, AJCC 8th Edition - Clinical stage from 06/28/2016: Stage IVB (cTX, cNX, pM1b) - Signed by Lloyd Huger, MD on 06/28/2016   Lloyd Huger, MD   06/30/2016 3:24 PM

## 2016-06-30 ENCOUNTER — Inpatient Hospital Stay: Payer: Medicare HMO | Attending: Oncology | Admitting: Oncology

## 2016-06-30 ENCOUNTER — Telehealth (INDEPENDENT_AMBULATORY_CARE_PROVIDER_SITE_OTHER): Payer: Self-pay

## 2016-06-30 ENCOUNTER — Other Ambulatory Visit: Payer: Self-pay | Admitting: Oncology

## 2016-06-30 VITALS — BP 164/95 | HR 55 | Temp 96.3°F | Wt 130.1 lb

## 2016-06-30 DIAGNOSIS — Z87442 Personal history of urinary calculi: Secondary | ICD-10-CM | POA: Insufficient documentation

## 2016-06-30 DIAGNOSIS — I10 Essential (primary) hypertension: Secondary | ICD-10-CM

## 2016-06-30 DIAGNOSIS — Z95810 Presence of automatic (implantable) cardiac defibrillator: Secondary | ICD-10-CM | POA: Diagnosis not present

## 2016-06-30 DIAGNOSIS — G20 Parkinson's disease: Secondary | ICD-10-CM | POA: Insufficient documentation

## 2016-06-30 DIAGNOSIS — R970 Elevated carcinoembryonic antigen [CEA]: Secondary | ICD-10-CM | POA: Insufficient documentation

## 2016-06-30 DIAGNOSIS — J449 Chronic obstructive pulmonary disease, unspecified: Secondary | ICD-10-CM | POA: Diagnosis not present

## 2016-06-30 DIAGNOSIS — C7800 Secondary malignant neoplasm of unspecified lung: Secondary | ICD-10-CM | POA: Diagnosis not present

## 2016-06-30 DIAGNOSIS — C787 Secondary malignant neoplasm of liver and intrahepatic bile duct: Secondary | ICD-10-CM | POA: Diagnosis not present

## 2016-06-30 DIAGNOSIS — C187 Malignant neoplasm of sigmoid colon: Secondary | ICD-10-CM | POA: Diagnosis not present

## 2016-06-30 DIAGNOSIS — C189 Malignant neoplasm of colon, unspecified: Secondary | ICD-10-CM

## 2016-06-30 DIAGNOSIS — K219 Gastro-esophageal reflux disease without esophagitis: Secondary | ICD-10-CM | POA: Insufficient documentation

## 2016-06-30 DIAGNOSIS — Z951 Presence of aortocoronary bypass graft: Secondary | ICD-10-CM | POA: Diagnosis not present

## 2016-06-30 DIAGNOSIS — F329 Major depressive disorder, single episode, unspecified: Secondary | ICD-10-CM | POA: Diagnosis not present

## 2016-06-30 DIAGNOSIS — I2510 Atherosclerotic heart disease of native coronary artery without angina pectoris: Secondary | ICD-10-CM | POA: Diagnosis not present

## 2016-06-30 DIAGNOSIS — I252 Old myocardial infarction: Secondary | ICD-10-CM | POA: Insufficient documentation

## 2016-06-30 DIAGNOSIS — Z7982 Long term (current) use of aspirin: Secondary | ICD-10-CM | POA: Insufficient documentation

## 2016-06-30 DIAGNOSIS — R7303 Prediabetes: Secondary | ICD-10-CM | POA: Diagnosis not present

## 2016-06-30 DIAGNOSIS — Z7189 Other specified counseling: Secondary | ICD-10-CM

## 2016-06-30 DIAGNOSIS — F17210 Nicotine dependence, cigarettes, uncomplicated: Secondary | ICD-10-CM | POA: Insufficient documentation

## 2016-06-30 DIAGNOSIS — Z79899 Other long term (current) drug therapy: Secondary | ICD-10-CM

## 2016-06-30 MED ORDER — ONDANSETRON HCL 8 MG PO TABS: 8.0000 mg | ORAL_TABLET | Freq: Two times a day (BID) | ORAL | 1 refills | Status: DC | PRN

## 2016-06-30 MED ORDER — LIDOCAINE-PRILOCAINE 2.5-2.5 % EX CREA: TOPICAL_CREAM | CUTANEOUS | 3 refills | Status: DC

## 2016-06-30 MED ORDER — PROCHLORPERAZINE MALEATE 10 MG PO TABS: 10.0000 mg | ORAL_TABLET | Freq: Four times a day (QID) | ORAL | 1 refills | Status: DC | PRN

## 2016-06-30 NOTE — Progress Notes (Signed)
START ON PATHWAY REGIMEN - Colorectal  [id number]: [id number] q14 Days   A cycle is every 14 days:     Oxaliplatin (Eloxatin(R)) 85 mg/m2 in 250 mL D5W IV over 2 hours day 1, q14 days Dose Mod: None     Leucovorin 400 mg/m2 in 250 mL D5W IV over 2 hours day 1, q14 days, followed immediately by Dose Mod: None     5-Fluorouracil 400 mg/m2 IV bolus over 2-4 minutes day 1, q14 days Dose Mod: None     5-Fluorouracil 2,400 mg/m2 in _____mL NS CIV as a 46 hour infusion starting on day 1, q14 days Dose Mod: None Additional Orders: **Note: order sheet contains two q14 day cycles**  **Always confirm dose/schedule in your pharmacy ordering system**    Patient Characteristics: Metastatic Colorectal, First Line, Nonsurgical Candidate, KRAS Mutation Positive/Unknown, BRAF Wild-Type/Unknown, PS = 0,1; Bevacizumab Ineligible Current evidence of distant metastases? Yes AJCC T Category: TX AJCC N Category: NX AJCC M Category: M1b AJCC 8 Stage Grouping: IVB BRAF Mutation Status: Awaiting Test Results KRAS/NRAS Mutation Status: Awaiting Test Results Line of therapy: First Line Would you be surprised if this patient died  in the next year? I would NOT be surprised if this patient died in the next year Performance Status: PS = 0, 1 Bevacizumab Eligibility: Ineligible  Intent of Therapy: Non-Curative / Palliative Intent, Discussed with Patient

## 2016-06-30 NOTE — Telephone Encounter (Signed)
Left a message for the patient to contact me regarding having his port placed.

## 2016-07-03 ENCOUNTER — Ambulatory Visit: Payer: Medicare HMO | Admitting: Oncology

## 2016-07-03 ENCOUNTER — Other Ambulatory Visit (INDEPENDENT_AMBULATORY_CARE_PROVIDER_SITE_OTHER): Payer: Self-pay

## 2016-07-03 NOTE — Patient Instructions (Signed)

## 2016-07-04 ENCOUNTER — Inpatient Hospital Stay: Payer: Medicare HMO

## 2016-07-05 NOTE — Progress Notes (Signed)
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  INTERVAL HISTORY: Patient returns to clinic today for further evaluation and initiation of cycle 1 of FOLFOX. He continues to feel well and is asymptomatic. He has no neurologic complaints. He denies any recent fevers. He does not complain of cough or shortness of breath today. He denies any chest pain. He has no nausea, vomiting, constipation, or diarrhea. He continues to have mucous in his bowel movements, but denies any melena or hematochezia. He denies any abdominal pain. He has no urinary complaints. Patient offers no specific complaints today.  REVIEW OF SYSTEMS:   Review of Systems  Constitutional: Negative.  Negative for fever, malaise/fatigue and weight loss.  Respiratory: Negative.  Negative for cough and shortness of breath.   Cardiovascular: Negative.  Negative for chest pain and leg swelling.  Gastrointestinal: Negative.  Negative for abdominal pain.  Genitourinary: Negative.   Musculoskeletal: Negative.   Neurological: Negative.  Negative for weakness.  Psychiatric/Behavioral: Negative.  The patient is not nervous/anxious.     As per HPI. Otherwise, a complete review of systems is negative.  PAST MEDICAL HISTORY: Past Medical History:  Diagnosis Date  . AICD (automatic cardioverter/defibrillator) present    2016  . COPD (chronic obstructive pulmonary disease) ([place])   . Coronary artery disease   . Depression   . GERD (gastroesophageal reflux disease)   . History of kidney stones   . Hypertension   . Myocardial infarction   . Parkinson disease ([place])   . Pre-diabetes   . vent tach    icd 2016    PAST SURGICAL HISTORY: Past Surgical  History:  Procedure Laterality Date  . CORONARY ARTERY BYPASS GRAFT    . Difibulator placement  2014  . PORTA CATH INSERTION N/A 07/06/2016   Procedure: Glori Luis Cath Insertion;  Surgeon: Algernon Huxley, MD;  Location: [facility];  Service: Cardiovascular;  Laterality: N/A;  . Triple bypass  1995    FAMILY HISTORY: Reviewed and unchanged. No reported history of malignancy or chronic disease.  ADVANCED DIRECTIVES (Y/N):  N  HEALTH MAINTENANCE: Social History  Substance Use Topics  . Smoking status: Current Some Day Smoker    Packs/day: 0.50    Years: 70.00  . Smokeless tobacco: Current User    Types: Chew  . Alcohol use Yes     Comment: just a little     Colonoscopy:  PAP:  Bone density:  Lipid panel:  No Known Allergies  Current Outpatient Prescriptions  Medication Sig Dispense Refill  . albuterol (PROVENTIL HFA;VENTOLIN HFA) 108 (90 Base) MCG/ACT inhaler Inhale 1-2 puffs into the lungs every 6 (six) hours as needed for wheezing or shortness of breath.     Marland Kitchen aspirin EC 81 MG tablet Take 81 mg by mouth daily.     . citalopram (CELEXA) 10 MG tablet Take 10 mg by mouth daily.     Marland Kitchen lidocaine-prilocaine (EMLA) cream Apply to affected area once 30 g 3  . lisinopril (PRINIVIL,ZESTRIL) 5 MG tablet Take 5 mg by mouth daily.   6  . metoprolol succinate (TOPROL-XL) 50 MG 24 hr tablet TAKE 50 TABLET BY MOUTH TWICE DAILY    . ondansetron (ZOFRAN) 8 MG tablet Take 1  tablet (8 mg total) by mouth 2 (two) times daily as needed for refractory nausea / vomiting. 30 tablet 1  . prochlorperazine (COMPAZINE) 10 MG tablet TAKE 1 TABLET BY MOUTH EVERY 6 HOURS AS NEEDED FOR NAUSEA OR VOMITING 337 tablet 1  . ranitidine (ZANTAC) 150 MG tablet TAKE 1 TABLET(150 MG) BY MOUTH EVERY DAY    . simvastatin (ZOCOR) 20 MG tablet TAKE 1 TABLET(20 MG) BY MOUTH EVERY NIGHT    . Chlorpheniramine-DM (CORICIDIN HBP COUGH/COLD PO) Take 1 tablet by mouth daily as needed (cough).     Current  Facility-Administered Medications  Medication Dose Route Frequency Provider Last Rate Last Dose  . ceFAZolin (ANCEF) IVPB 1 g/50 mL premix  1 g Intravenous Once Algernon Huxley, MD        OBJECTIVE: There were no vitals filed for this visit.   There is no height or weight on file to calculate BMI.    ECOG FS:1 - Symptomatic but completely ambulatory  General: Well-developed, well-nourished, no acute distress. Eyes: Pink conjunctiva, anicteric sclera. Lungs: Clear to auscultation bilaterally. Heart: Regular rate and rhythm. No rubs, murmurs, or gallops. Abdomen: Soft, nontender, nondistended. No organomegaly noted, normoactive bowel sounds. Musculoskeletal: No edema, cyanosis, or clubbing. Neuro: Alert, answering all questions appropriately. Cranial nerves grossly intact. Skin: No rashes or petechiae noted. Psych: Normal affect.   LAB RESULTS:  Lab Results  Component Value Date   NA 141 07/07/2016   K 3.9 07/07/2016   CL 103 07/07/2016   CO2 30 07/07/2016   GLUCOSE 103 (H) 07/07/2016   BUN 21 (H) 07/07/2016   CREATININE 1.16 07/07/2016   CALCIUM 9.1 07/07/2016   PROT 6.9 07/07/2016   ALBUMIN 3.3 (L) 07/07/2016   AST 32 07/07/2016   ALT 16 (L) 07/07/2016   ALKPHOS 159 (H) 07/07/2016   BILITOT 0.8 07/07/2016   GFRNONAA 57 (L) 07/07/2016   GFRAA >60 07/07/2016    Lab Results  Component Value Date   WBC 7.8 07/07/2016   NEUTROABS 5.8 07/07/2016   HGB 13.4 07/07/2016   HCT 40.8 07/07/2016   MCV 97.4 07/07/2016   PLT 194 07/07/2016   Lab Results  Component Value Date   CEA 151.4 (H) 07/07/2016   Lab Results  Component Value Date   CA199 636 (H) 07/07/2016     STUDIES: US Biopsy  Result Date: 07/10/2016 CLINICAL DATA:  Colon carcinoma. Hepatic and pulmonary metastatic disease on PET-CT. EXAM: ULTRASOUND GUIDED CORE BIOPSY OF LIVER LESION MEDICATIONS: Intravenous Fentanyl and Versed were administered as conscious sedation during continuous monitoring of the patient's  level of consciousness and physiological / cardiorespiratory status by the radiology RN, with a total moderate sedation time of 10 minutes. PROCEDURE: The procedure, risks, benefits, and alternatives were explained to the patient. Questions regarding the procedure were encouraged and answered. The patient understands and consents to the procedure. Survey ultrasound of the liver was performed. A representative lesion in the right lobe was localized and appropriate skin entry site determined and marked. The operative field was prepped with chlorhexidine in a sterile fashion, and a sterile drape was applied covering the operative field. A sterile gown and sterile gloves were used for the procedure. Local anesthesia was provided with 1% Lidocaine. Under real-time ultrasound guidance, a 17 gauge trocar needle was advanced to the margin of the lesion. Once needle tip position was confirmed, coaxial 18-gauge core biopsy samples were obtained, submitted in formalin to surgical pathology. The guide needle was removed. Postprocedure scans show no  hemorrhage or other apparent complication. The patient tolerated the procedure well. COMPLICATIONS: None. FINDINGS: Multiple liver lesions were identified corresponding to findings on PET-CT. Representative ultrasound-guided core biopsy obtained as above. IMPRESSION: 1. Technically successful core biopsy of liver lesion. Electronically Signed   By: Lucrezia Europe M.D.   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 K-ras results are pending. No loss of nuclear expression of MMR proteins: Low probability of MSI-H.  Will not use Avastin initially pending any possible surgical intervention. PET scan results reviewed independently and reported as above with widespread metastatic disease in liver and lungs. Both CEA  and CA-19-9 are significantly elevated. Proceed with cycle 1 of palliative FOLFOX on Monday. Patient will have laboratory work and evaluation Fridays and then treatment the following Monday. Return to clinic in 2 weeks for further evaluation and consideration of cycle 2.   2. Hypertension: Patient's blood pressure is within normal limits today. Monitor.   Approximately 30 minutes was spent in discussion of which greater than 50% was consultation.  Patient expressed understanding and was in agreement with this plan. He also understands that He can call clinic at any time with any questions, concerns, or complaints.   Cancer Staging Metastatic colon cancer to liver [facility]) Staging form: Colon and Rectum, AJCC 8th Edition - Clinical stage from 06/28/2016: Stage IVB (cTX, cNX, pM1b) - Signed by Lloyd Huger, MD on 06/28/2016   Lloyd Huger, MD   07/09/2016 8:33 AM

## 2016-07-06 ENCOUNTER — Ambulatory Visit
Admission: RE | Admit: 2016-07-06 | Discharge: 2016-07-06 | Disposition: A | Discharge status: Home / Self Care | Payer: Medicare HMO | Source: Ambulatory Visit | Attending: Vascular Surgery | Admitting: Vascular Surgery

## 2016-07-06 ENCOUNTER — Encounter
Admission: RE | Disposition: A | Discharge status: Home / Self Care | Payer: Self-pay | Source: Ambulatory Visit | Attending: Vascular Surgery

## 2016-07-06 DIAGNOSIS — J449 Chronic obstructive pulmonary disease, unspecified: Secondary | ICD-10-CM | POA: Insufficient documentation

## 2016-07-06 DIAGNOSIS — I10 Essential (primary) hypertension: Secondary | ICD-10-CM | POA: Insufficient documentation

## 2016-07-06 DIAGNOSIS — F17210 Nicotine dependence, cigarettes, uncomplicated: Secondary | ICD-10-CM | POA: Insufficient documentation

## 2016-07-06 DIAGNOSIS — Z7982 Long term (current) use of aspirin: Secondary | ICD-10-CM | POA: Diagnosis not present

## 2016-07-06 DIAGNOSIS — R7303 Prediabetes: Secondary | ICD-10-CM | POA: Diagnosis not present

## 2016-07-06 DIAGNOSIS — K219 Gastro-esophageal reflux disease without esophagitis: Secondary | ICD-10-CM | POA: Insufficient documentation

## 2016-07-06 DIAGNOSIS — G20 Parkinson's disease: Secondary | ICD-10-CM | POA: Insufficient documentation

## 2016-07-06 DIAGNOSIS — C187 Malignant neoplasm of sigmoid colon: Secondary | ICD-10-CM | POA: Diagnosis not present

## 2016-07-06 DIAGNOSIS — Z87442 Personal history of urinary calculi: Secondary | ICD-10-CM | POA: Insufficient documentation

## 2016-07-06 DIAGNOSIS — Z955 Presence of coronary angioplasty implant and graft: Secondary | ICD-10-CM | POA: Diagnosis not present

## 2016-07-06 DIAGNOSIS — I2510 Atherosclerotic heart disease of native coronary artery without angina pectoris: Secondary | ICD-10-CM | POA: Diagnosis not present

## 2016-07-06 DIAGNOSIS — I252 Old myocardial infarction: Secondary | ICD-10-CM | POA: Insufficient documentation

## 2016-07-06 DIAGNOSIS — Z95810 Presence of automatic (implantable) cardiac defibrillator: Secondary | ICD-10-CM | POA: Insufficient documentation

## 2016-07-06 DIAGNOSIS — C19 Malignant neoplasm of rectosigmoid junction: Secondary | ICD-10-CM | POA: Diagnosis not present

## 2016-07-06 HISTORY — PX: PORTA CATH INSERTION: CATH118285

## 2016-07-06 SURGERY — PORTA CATH INSERTION
Anesthesia: Moderate Sedation

## 2016-07-06 MED ORDER — MIDAZOLAM HCL 5 MG/5ML IJ SOLN
INTRAMUSCULAR | Status: AC
  Filled 2016-07-06: qty 5

## 2016-07-06 MED ORDER — HEPARIN (PORCINE) IN NACL 2-0.9 UNIT/ML-% IJ SOLN
INTRAMUSCULAR | Status: AC
  Filled 2016-07-06: qty 500

## 2016-07-06 MED ORDER — CEFAZOLIN IN D5W 1 GM/50ML IV SOLN
1.0000 g | Freq: Once | INTRAVENOUS | Status: AC
  Administered 2016-07-06: 1 g via INTRAVENOUS

## 2016-07-06 MED ORDER — FENTANYL CITRATE (PF) 100 MCG/2ML IJ SOLN
INTRAMUSCULAR | Status: DC | PRN
  Administered 2016-07-06 (×2): 50 ug via INTRAVENOUS

## 2016-07-06 MED ORDER — SODIUM CHLORIDE 0.9 % IV SOLN
INTRAVENOUS | Status: DC
  Administered 2016-07-06: 08:00:00 via INTRAVENOUS

## 2016-07-06 MED ORDER — CEFAZOLIN IN D5W 1 GM/50ML IV SOLN
1.0000 g | Freq: Once | INTRAVENOUS | Status: AC
Start: 2016-07-06 — End: ?

## 2016-07-06 MED ORDER — SODIUM CHLORIDE 0.9 % IR SOLN
Freq: Once | Status: DC
  Filled 2016-07-06: qty 2

## 2016-07-06 MED ORDER — FENTANYL CITRATE (PF) 100 MCG/2ML IJ SOLN
INTRAMUSCULAR | Status: AC
  Filled 2016-07-06: qty 2

## 2016-07-06 MED ORDER — MIDAZOLAM HCL 2 MG/2ML IJ SOLN
INTRAMUSCULAR | Status: DC | PRN
  Administered 2016-07-06: 2 mg via INTRAVENOUS
  Administered 2016-07-06: 1 mg via INTRAVENOUS

## 2016-07-06 MED ORDER — LIDOCAINE-EPINEPHRINE (PF) 2 %-1:200000 IJ SOLN
INTRAMUSCULAR | Status: AC
  Filled 2016-07-06: qty 40

## 2016-07-06 SURGICAL SUPPLY — 11 items
BAG DECANTER STRL (MISCELLANEOUS) ×3 IMPLANT
KIT PORT POWER 8FR ISP CVUE (Catheter) ×3 IMPLANT
PACK ANGIOGRAPHY (CUSTOM PROCEDURE TRAY) ×3 IMPLANT
PAD GROUND ADULT SPLIT (MISCELLANEOUS) ×3 IMPLANT
PENCIL ELECTRO HAND CTR (MISCELLANEOUS) ×3 IMPLANT
PREP CHG 10.5 TEAL (MISCELLANEOUS) ×3 IMPLANT
SUT MNCRL AB 4-0 PS2 18 (SUTURE) ×3 IMPLANT
SUT PROLENE 0 CT 1 30 (SUTURE) ×3 IMPLANT
SUTURE VIC 3-0 (SUTURE) ×3 IMPLANT
TOWEL OR 17X26 4PK STRL BLUE (TOWEL DISPOSABLE) ×3 IMPLANT
TRAY SUT REMOVAL LITTAUER SCS (KITS) ×3 IMPLANT

## 2016-07-06 NOTE — H&P (Signed)
[facility] History & Physical Update  The patient was interviewed and re-examined.  The patient's previous History and Physical has been reviewed and is unchanged.  There is no change in the plan of care. We plan to proceed with the scheduled procedure.  Leotis Pain, MD  07/06/2016, 8:04 AM

## 2016-07-06 NOTE — Discharge Instructions (Signed)
Tunneled Catheter Insertion, Care After  Refer to this sheet in the next few weeks. These instructions provide you with information about caring for yourself after your procedure. Your health care provider may also give you more specific instructions. Your treatment has been planned according to current medical practices, but problems sometimes occur. Call your health care provider if you have any problems or questions after your procedure.  What can I expect after the procedure?  After the procedure, it is common to have:  · Some mild redness, swelling, and pain around your catheter site.  · A small amount of blood or clear fluid coming from your incisions.    Follow these instructions at home:  Incision care   · Check your incision areas every day for signs of infection. Check for:  ? More redness, swelling, or pain.  ? More fluid or blood.  ? Warmth.  ? Pus or a bad smell.  · Follow instructions from your health care provider about how to take care of your incisions. Make sure you:  ? Wash your hands with soap and water before you change your bandages (dressings). If soap and water are not available, use hand sanitizer.  ? Change your dressings as told by your health care provider. Wash the area around your incisions with a germ-killing (antiseptic) solution when you change your dressing, as told by your health care provider.  ? Leave stitches (sutures), skin glue, or adhesive strips in place. These skin closures may need to stay in place for 2 weeks or longer. If adhesive strip edges start to loosen and curl up, you may trim the loose edges. Do not remove adhesive strips completely unless your health care provider tells you to do that.  Catheter Care     · Wash your hands with soap and water before and after caring for your catheter. If soap and water are not available, use hand sanitizer.  · Keep your catheter site and your dressings clean and dry.  · Apply an antibiotic ointment to your catheter site as told  by your health care provider.  · Flush your catheter as told by your health care provider. This helps prevent it from becoming clogged.  · Do not open the caps on the ends of the catheter.  · Do not pull on your catheter.  · If your catheter is in your arm:  ? Avoid wearing tight clothes or tight jewelry on your arm that has the catheter.  ? Do not sleep with your head on the arm that has the catheter.  ? Do not allow your blood pressure to be taken on the arm that has the catheter.  ? Do not allow your blood to be drawn from the arm that has the catheter, except through the catheter itself.  Medicines   · Take over-the-counter and prescription medicines only as told by your health care provider.  · If you were prescribed an antibiotic medicine, take it as told by your health care provider. Do not stop taking the antibiotic even if you start to feel better.  Activity   · Return to your normal activities as told by your health care provider. Ask your health care provider what activities are safe for you.  · Do not lift anything that is heavier than 10 lb (4.5 kg) for 3 weeks or as long as told by your health care provider.  Driving   · Do not drive until your health care provider approves.  ·   Do not drive or operate heavy machinery while taking prescription pain medicine.  General instructions   · Follow your health care provider's specific instructions for the type of catheter that you have.  · Do not take baths, swim, or use a hot tub until your health care provider approves.  · Follow instructions from your health care provider about eating or drinking restrictions.  · Wear compression stockings as told by your health care provider. These stockings help to prevent blood clots and reduce swelling in your legs.  · Keep all follow-up visits as told by your health care provider. This is important.  Contact a health care provider if:  · You have more fluid or blood coming from your incisions.  · You have more redness,  swelling, or pain at your incisions or around the area where your catheter is inserted.  · Your incisions feel warm to the touch.  · You feel unusually weak.  · You feel nauseous.  · Your catheter is not working properly.  · You have blood or fluid draining from your catheter.  · You are unable to flush your catheter.  Get help right away if:  · Your catheter breaks.  · A hole develops in your catheter.  · Your catheter comes loose or gets pulled completely out. If this happens, press on your catheter site firmly with your hand or a clean cloth until you get medical help.  · Your catheter becomes blocked.  · You have swelling in your arm, shoulder, neck, or face.  · You develop chest pain.  · You have difficulty breathing.  · You feel dizzy or light-headed.  · You have pus or a bad smell coming from your incisions.  · You have a fever.  · You develop bleeding from your catheter or your insertion site, and your bleeding does not stop.  This information is not intended to replace advice given to you by your health care provider. Make sure you discuss any questions you have with your health care provider.  Document Released: 04/24/2012 Document Revised: 01/09/2016 Document Reviewed: 02/01/2015  Elsevier Interactive Patient Education © 2017 [employer]

## 2016-07-06 NOTE — Op Note (Signed)
      [facility]       Operative Note  Date: 07/06/2016  Preoperative diagnosis:  1. Colorectal cancer  Postoperative diagnosis:  Same as above  Procedures: #1. Ultrasound guidance for vascular access to the right internal jugular vein. #2. Fluoroscopic guidance for placement of catheter. #3. Placement of CT compatible Port-A-Cath, right internal jugular vein.  Surgeon: Leotis Pain, MD.   Anesthesia: Local with moderate conscious sedation for approximately 25  minutes using 3 mg of Versed and 100 mcg of Fentanyl  Fluoroscopy time: less than 1 minute  Contrast used: 0  Estimated blood loss: minimal  Indication for the procedure:  The patient is a 83 y.o.male with colorectal cancer.  The patient needs a Port-A-Cath for durable venous access, chemotherapy, lab draws, and CT scans. We are asked to place this. Risks and benefits were discussed and informed consent was obtained.  Description of procedure: The patient was brought to the vascular and interventional radiology suite.  Moderate conscious sedation was administered throughout the procedure during a face to face encounter with the patient with my supervision of the RN administering medicines and monitoring the patient's vital signs, pulse oximetry, telemetry and mental status throughout from the start of the procedure until the patient was taken to the recovery room. The right neck chest and shoulder were sterilely prepped and draped, and a sterile surgical field was created. Ultrasound was used to help visualize a patent right internal jugular vein. This was then accessed under direct ultrasound guidance without difficulty with the Seldinger needle and a permanent image was recorded. A J-wire was placed. After skin nick and dilatation, the peel-away sheath was then placed over the wire. I then anesthetized an area under the clavicle approximately 1-2 fingerbreadths. A transverse incision was created and an inferior  pocket was created with electrocautery and blunt dissection. The port was then brought onto the field, placed into the pocket and secured to the chest wall with 2 Prolene sutures. The catheter was connected to the port and tunneled from the subclavicular incision to the access site. Fluoroscopic guidance was then used to cut the catheter to an appropriate length. The catheter was then placed through the peel-away sheath and the peel-away sheath was removed. The catheter tip was parked in excellent location under fluorocoscopic guidance in the cavoatrial junction. The pocket was then irrigated with antibiotic impregnated saline and the wound was closed with a running 3-0 Vicryl and a 4-0 Monocryl. The access incision was closed with a single 4-0 Monocryl. The Huber needle was used to withdraw blood and flush the port with heparinized saline. Dermabond was then placed as a dressing. The patient tolerated the procedure well and was taken to the recovery room in stable condition.   Leotis Pain 07/06/2016 9:40 AM   This note was created with Dragon Medical transcription system. Any errors in dictation are purely unintentional.

## 2016-07-07 ENCOUNTER — Inpatient Hospital Stay (HOSPITAL_BASED_OUTPATIENT_CLINIC_OR_DEPARTMENT_OTHER): Payer: Medicare HMO | Admitting: Oncology

## 2016-07-07 ENCOUNTER — Inpatient Hospital Stay: Payer: Medicare HMO

## 2016-07-07 ENCOUNTER — Encounter: Payer: Self-pay | Admitting: Vascular Surgery

## 2016-07-07 DIAGNOSIS — C7800 Secondary malignant neoplasm of unspecified lung: Secondary | ICD-10-CM | POA: Diagnosis not present

## 2016-07-07 DIAGNOSIS — R970 Elevated carcinoembryonic antigen [CEA]: Secondary | ICD-10-CM

## 2016-07-07 DIAGNOSIS — C189 Malignant neoplasm of colon, unspecified: Secondary | ICD-10-CM

## 2016-07-07 DIAGNOSIS — I10 Essential (primary) hypertension: Secondary | ICD-10-CM

## 2016-07-07 DIAGNOSIS — F17210 Nicotine dependence, cigarettes, uncomplicated: Secondary | ICD-10-CM

## 2016-07-07 DIAGNOSIS — C787 Secondary malignant neoplasm of liver and intrahepatic bile duct: Secondary | ICD-10-CM

## 2016-07-07 DIAGNOSIS — Z79899 Other long term (current) drug therapy: Secondary | ICD-10-CM

## 2016-07-07 DIAGNOSIS — C187 Malignant neoplasm of sigmoid colon: Secondary | ICD-10-CM | POA: Diagnosis not present

## 2016-07-07 LAB — COMPREHENSIVE METABOLIC PANEL
ALT: 16 U/L — AB (ref 17–63)
AST: 32 U/L (ref 15–41)
Albumin: 3.3 g/dL — ABNORMAL LOW (ref 3.5–5.0)
Alkaline Phosphatase: 159 U/L — ABNORMAL HIGH (ref 38–126)
Anion gap: 8 (ref 5–15)
BUN: 21 mg/dL — ABNORMAL HIGH (ref 6–20)
CHLORIDE: 103 mmol/L (ref 101–111)
CO2: 30 mmol/L (ref 22–32)
CREATININE: 1.16 mg/dL (ref 0.61–1.24)
Calcium: 9.1 mg/dL (ref 8.9–10.3)
GFR calc Af Amer: >60 mL/min (ref >60)
GFR calc non Af Amer: 57 mL/min — ABNORMAL LOW (ref >60)
Glucose, Bld: 103 mg/dL — ABNORMAL HIGH (ref 65–99)
Potassium: 3.9 mmol/L (ref 3.5–5.1)
Sodium: 141 mmol/L (ref 135–145)
Total Bilirubin: 0.8 mg/dL (ref 0.3–1.2)
Total Protein: 6.9 g/dL (ref 6.5–8.1)

## 2016-07-07 LAB — CBC WITH DIFFERENTIAL/PLATELET
BASOS ABS: 0.1 10*3/uL (ref 0–0.1)
Basophils Relative: 1 %
EOS PCT: 2 %
Eosinophils Absolute: 0.2 10*3/uL (ref 0–0.7)
HCT: 40.8 % (ref 40.0–52.0)
Hemoglobin: 13.4 g/dL (ref 13.0–18.0)
LYMPHS PCT: 15 %
Lymphs Abs: 1.2 10*3/uL (ref 1.0–3.6)
MCH: 32 pg (ref 26.0–34.0)
MCHC: 32.8 g/dL (ref 32.0–36.0)
MCV: 97.4 fL (ref 80.0–100.0)
Monocytes Absolute: 0.6 10*3/uL (ref 0.2–1.0)
Monocytes Relative: 8 %
Neutro Abs: 5.8 10*3/uL (ref 1.4–6.5)
Neutrophils Relative %: 74 %
PLATELETS: 194 10*3/uL (ref 150–440)
RBC: 4.19 MIL/uL — AB (ref 4.40–5.90)
RDW: 16.4 % — ABNORMAL HIGH (ref 11.5–14.5)
WBC: 7.8 10*3/uL (ref 3.8–10.6)

## 2016-07-07 NOTE — Progress Notes (Signed)
Here to se MD prior to starting chemo next week. Had port placed yesterday. Still tender. Appetite is fairly good. Occ abdominal pain across his abdomen.

## 2016-07-08 LAB — CANCER ANTIGEN 19-9: CA 19 9: 636 U/mL — AB (ref 0–35)

## 2016-07-08 LAB — CEA: CEA: 151.4 ng/mL — ABNORMAL HIGH (ref 0.0–4.7)

## 2016-07-10 ENCOUNTER — Ambulatory Visit: Payer: PRIVATE HEALTH INSURANCE

## 2016-07-11 ENCOUNTER — Inpatient Hospital Stay: Payer: Medicare HMO

## 2016-07-11 VITALS — BP 132/80 | HR 78 | Temp 97.8°F | Resp 20

## 2016-07-11 DIAGNOSIS — C189 Malignant neoplasm of colon, unspecified: Secondary | ICD-10-CM

## 2016-07-11 DIAGNOSIS — C187 Malignant neoplasm of sigmoid colon: Secondary | ICD-10-CM | POA: Diagnosis not present

## 2016-07-11 DIAGNOSIS — C787 Secondary malignant neoplasm of liver and intrahepatic bile duct: Principal | ICD-10-CM

## 2016-07-11 MED ORDER — DEXTROSE 5 % IV SOLN
Freq: Once | INTRAVENOUS | Status: AC
  Administered 2016-07-11: 10:00:00 via INTRAVENOUS
  Filled 2016-07-11: qty 1000

## 2016-07-11 MED ORDER — DEXAMETHASONE SODIUM PHOSPHATE 10 MG/ML IJ SOLN
10.0000 mg | Freq: Once | INTRAMUSCULAR | Status: AC
Start: 2016-07-11 — End: 2016-07-11
  Administered 2016-07-11: 10 mg via INTRAVENOUS
  Filled 2016-07-11: qty 1

## 2016-07-11 MED ORDER — OXALIPLATIN CHEMO INJECTION 100 MG/20ML
85.0000 mg/m2 | Freq: Once | INTRAVENOUS | Status: AC
  Administered 2016-07-11: 145 mg via INTRAVENOUS
  Filled 2016-07-11: qty 20

## 2016-07-11 MED ORDER — SODIUM CHLORIDE 0.9% FLUSH
10.0000 mL | INTRAVENOUS | Status: DC | PRN
  Administered 2016-07-11: 10 mL
  Filled 2016-07-11: qty 10

## 2016-07-11 MED ORDER — PALONOSETRON HCL INJECTION 0.25 MG/5ML
0.2500 mg | Freq: Once | INTRAVENOUS | Status: AC
  Administered 2016-07-11: 0.25 mg via INTRAVENOUS
  Filled 2016-07-11: qty 5

## 2016-07-11 MED ORDER — FLUOROURACIL CHEMO INJECTION 2.5 GM/50ML
400.0000 mg/m2 | Freq: Once | INTRAVENOUS | Status: AC
  Administered 2016-07-11: 700 mg via INTRAVENOUS
  Filled 2016-07-11: qty 14

## 2016-07-11 MED ORDER — SODIUM CHLORIDE 0.9 % IV SOLN
2400.0000 mg/m2 | INTRAVENOUS | Status: DC
  Administered 2016-07-11: 4050 mg via INTRAVENOUS
  Filled 2016-07-11: qty 81

## 2016-07-11 MED ORDER — LEUCOVORIN CALCIUM INJECTION 350 MG
700.0000 mg | Freq: Once | INTRAVENOUS | Status: AC
  Administered 2016-07-11: 700 mg via INTRAVENOUS
  Filled 2016-07-11: qty 35

## 2016-07-11 NOTE — Progress Notes (Signed)
Patient feeling sick this morning.Vomitted eating his breakfast . Son states patient started getting nauseated on Saturday. Patient reports he has started having diarrhea this am as well. VSS. Starting his 1st FOLFOX treatment this morning.

## 2016-07-11 NOTE — Patient Instructions (Signed)
Written handouts given to patients son regarding new chemotherapy drugs and side effects. Instructed son on symptoms requiring a call to the MD. Showed son the 800 number on the chemo pump should it start alarming or malfunctioning. Instructed patint not to get his port needle wet.

## 2016-07-12 LAB — SURGICAL PATHOLOGY

## 2016-07-13 ENCOUNTER — Encounter: Payer: Self-pay | Admitting: Oncology

## 2016-07-13 ENCOUNTER — Inpatient Hospital Stay: Payer: Medicare HMO

## 2016-07-13 VITALS — BP 126/73 | HR 68 | Temp 95.1°F | Resp 20

## 2016-07-13 DIAGNOSIS — C187 Malignant neoplasm of sigmoid colon: Secondary | ICD-10-CM | POA: Diagnosis not present

## 2016-07-13 DIAGNOSIS — C189 Malignant neoplasm of colon, unspecified: Secondary | ICD-10-CM

## 2016-07-13 DIAGNOSIS — C787 Secondary malignant neoplasm of liver and intrahepatic bile duct: Principal | ICD-10-CM

## 2016-07-13 MED ORDER — HEPARIN SOD (PORK) LOCK FLUSH 100 UNIT/ML IV SOLN
INTRAVENOUS | Status: AC
  Filled 2016-07-13: qty 5

## 2016-07-13 MED ORDER — SODIUM CHLORIDE 0.9% FLUSH
10.0000 mL | INTRAVENOUS | Status: DC | PRN
  Administered 2016-07-13: 10 mL
  Filled 2016-07-13: qty 10

## 2016-07-13 MED ORDER — HEPARIN SOD (PORK) LOCK FLUSH 100 UNIT/ML IV SOLN
500.0000 [IU] | Freq: Once | INTRAVENOUS | Status: AC | PRN
  Administered 2016-07-13: 500 [IU]

## 2016-07-13 NOTE — Progress Notes (Signed)
Here in clinic this morning  to get pump removed after 1st FOLFOX treatment. Patients states he feels weak and fatigued. Some cold neuropathy present. Reports no diarrhea or vomiting since Tuesday morning. Ate a good breakfast today. Patient is frail. Ambulating with a cane. Denies pain.

## 2016-07-17 ENCOUNTER — Encounter: Payer: Self-pay | Admitting: Oncology

## 2016-07-17 ENCOUNTER — Inpatient Hospital Stay: Payer: Medicare HMO

## 2016-07-18 ENCOUNTER — Inpatient Hospital Stay: Payer: Medicare HMO

## 2016-07-18 ENCOUNTER — Other Ambulatory Visit: Payer: Self-pay | Admitting: *Deleted

## 2016-07-18 VITALS — BP 107/70 | HR 62 | Temp 96.8°F | Resp 22

## 2016-07-18 DIAGNOSIS — C187 Malignant neoplasm of sigmoid colon: Secondary | ICD-10-CM | POA: Diagnosis not present

## 2016-07-18 DIAGNOSIS — R790 Abnormal level of blood mineral: Secondary | ICD-10-CM

## 2016-07-18 DIAGNOSIS — C189 Malignant neoplasm of colon, unspecified: Secondary | ICD-10-CM

## 2016-07-18 DIAGNOSIS — E860 Dehydration: Secondary | ICD-10-CM

## 2016-07-18 DIAGNOSIS — C787 Secondary malignant neoplasm of liver and intrahepatic bile duct: Principal | ICD-10-CM

## 2016-07-18 LAB — COMPREHENSIVE METABOLIC PANEL
ALBUMIN: 3.1 g/dL — AB (ref 3.5–5.0)
ALT: 16 U/L — ABNORMAL LOW (ref 17–63)
ANION GAP: 14 (ref 5–15)
AST: 51 U/L — ABNORMAL HIGH (ref 15–41)
Alkaline Phosphatase: 155 U/L — ABNORMAL HIGH (ref 38–126)
BUN: 22 mg/dL — ABNORMAL HIGH (ref 6–20)
CHLORIDE: 97 mmol/L — AB (ref 101–111)
CO2: 25 mmol/L (ref 22–32)
Calcium: 8.2 mg/dL — ABNORMAL LOW (ref 8.9–10.3)
Creatinine, Ser: 1.1 mg/dL (ref 0.61–1.24)
GFR calc Af Amer: >60 mL/min (ref >60)
GFR calc non Af Amer: >60 mL/min (ref >60)
GLUCOSE: 132 mg/dL — AB (ref 65–99)
POTASSIUM: 3.7 mmol/L (ref 3.5–5.1)
SODIUM: 136 mmol/L (ref 135–145)
TOTAL PROTEIN: 6.6 g/dL (ref 6.5–8.1)
Total Bilirubin: 0.9 mg/dL (ref 0.3–1.2)

## 2016-07-18 LAB — CBC WITH DIFFERENTIAL/PLATELET
BASOS PCT: 0 %
Basophils Absolute: 0 10*3/uL (ref 0–0.1)
EOS ABS: 0 10*3/uL (ref 0–0.7)
Eosinophils Relative: 0 %
HCT: 43.5 % (ref 40.0–52.0)
Hemoglobin: 14.2 g/dL (ref 13.0–18.0)
Lymphocytes Relative: 7 %
Lymphs Abs: 0.6 10*3/uL — ABNORMAL LOW (ref 1.0–3.6)
MCH: 31.6 pg (ref 26.0–34.0)
MCHC: 32.7 g/dL (ref 32.0–36.0)
MCV: 96.8 fL (ref 80.0–100.0)
MONO ABS: 0.2 10*3/uL (ref 0.2–1.0)
MONOS PCT: 2 %
NEUTROS PCT: 91 %
Neutro Abs: 8.1 10*3/uL — ABNORMAL HIGH (ref 1.4–6.5)
Platelets: 56 10*3/uL — ABNORMAL LOW (ref 150–440)
RBC: 4.49 MIL/uL (ref 4.40–5.90)
RDW: 15.5 % — ABNORMAL HIGH (ref 11.5–14.5)
WBC: 9 10*3/uL (ref 3.8–10.6)

## 2016-07-18 LAB — MAGNESIUM: Magnesium: 1.7 mg/dL (ref 1.7–2.4)

## 2016-07-18 MED ORDER — HEPARIN SOD (PORK) LOCK FLUSH 100 UNIT/ML IV SOLN
INTRAVENOUS | Status: AC
  Filled 2016-07-18: qty 5

## 2016-07-18 MED ORDER — SODIUM CHLORIDE 0.9% FLUSH
10.0000 mL | INTRAVENOUS | Status: DC | PRN
  Administered 2016-07-18: 10 mL via INTRAVENOUS
  Filled 2016-07-18: qty 10

## 2016-07-18 MED ORDER — SODIUM CHLORIDE 0.9 % IV SOLN
Freq: Once | INTRAVENOUS | Status: AC
  Administered 2016-07-18: 10:00:00 via INTRAVENOUS
  Filled 2016-07-18: qty 1000

## 2016-07-18 MED ORDER — HEPARIN SOD (PORK) LOCK FLUSH 100 UNIT/ML IV SOLN
500.0000 [IU] | Freq: Once | INTRAVENOUS | Status: AC
  Administered 2016-07-18: 500 [IU] via INTRAVENOUS

## 2016-07-18 NOTE — Progress Notes (Signed)
Son here with patient for lab only appointment. Patient is very weak in a wheelchair. Frail. Son wanted to see Dr Grayland Ormond to let him know patient was found slumped over washing machine yesterday. Very short of breath, too weak to move. 911 called and brought patient to [facility] ER. He was given fluids and IV magnesium according to son. Patient BP low today. He was added on for IVF's. Patient told me he fell this morning trying to get his pants on. He told his son he was going to need to go to a nursing home due to his inability to walk and get around and extreme weakness. Discussed patients intolerance of the chemotherapy with son. Patient may not be able to continue treatments.

## 2016-07-19 ENCOUNTER — Telehealth: Payer: Self-pay | Admitting: *Deleted

## 2016-07-19 NOTE — Telephone Encounter (Signed)
Needs office note addended to state reason home health is needed and refaxed to them

## 2016-07-19 NOTE — Progress Notes (Deleted)
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  INTERVAL HISTORY: Patient returns to clinic today for further evaluation and initiation of cycle 1 of FOLFOX. He continues to feel well and is asymptomatic. He has no neurologic complaints. He denies any recent fevers. He does not complain of cough or shortness of breath today. He denies any chest pain. He has no nausea, vomiting, constipation, or diarrhea. He continues to have mucous in his bowel movements, but denies any melena or hematochezia. He denies any abdominal pain. He has no urinary complaints. Patient offers no specific complaints today.  REVIEW OF SYSTEMS:   Review of Systems  Constitutional: Negative.  Negative for fever, malaise/fatigue and weight loss.  Respiratory: Negative.  Negative for cough and shortness of breath.   Cardiovascular: Negative.  Negative for chest pain and leg swelling.  Gastrointestinal: Negative.  Negative for abdominal pain.  Genitourinary: Negative.   Musculoskeletal: Negative.   Neurological: Negative.  Negative for weakness.  Psychiatric/Behavioral: Negative.  The patient is not nervous/anxious.     As per HPI. Otherwise, a complete review of systems is negative.  PAST MEDICAL HISTORY: Past Medical History:  Diagnosis Date  . AICD (automatic cardioverter/defibrillator) present    2016  . COPD (chronic obstructive pulmonary disease) ([place])   . Coronary artery disease   . Depression   . GERD (gastroesophageal reflux disease)   . History of kidney stones   . Hypertension   . Myocardial infarction   . Parkinson disease ([place])   . Pre-diabetes   . vent tach    icd 2016    PAST SURGICAL HISTORY: Past Surgical  History:  Procedure Laterality Date  . CORONARY ARTERY BYPASS GRAFT    . Difibulator placement  2014  . PORTA CATH INSERTION N/A 07/06/2016   Procedure: Glori Luis Cath Insertion;  Surgeon: Algernon Huxley, MD;  Location: [facility];  Service: Cardiovascular;  Laterality: N/A;  . Triple bypass  1995    FAMILY HISTORY: Reviewed and unchanged. No reported history of malignancy or chronic disease.  ADVANCED DIRECTIVES (Y/N):  N  HEALTH MAINTENANCE: Social History  Substance Use Topics  . Smoking status: Current Some Day Smoker    Packs/day: 0.50    Years: 70.00  . Smokeless tobacco: Current User    Types: Chew  . Alcohol use Yes     Comment: just a little     Colonoscopy:  PAP:  Bone density:  Lipid panel:  No Known Allergies  Current Outpatient Prescriptions  Medication Sig Dispense Refill  . albuterol (PROVENTIL HFA;VENTOLIN HFA) 108 (90 Base) MCG/ACT inhaler Inhale 1-2 puffs into the lungs every 6 (six) hours as needed for wheezing or shortness of breath.     Marland Kitchen aspirin EC 81 MG tablet Take 81 mg by mouth daily.     . Chlorpheniramine-DM (CORICIDIN HBP COUGH/COLD PO) Take 1 tablet by mouth daily as needed (cough).    . citalopram (CELEXA) 10 MG tablet Take 10 mg by mouth daily.     Marland Kitchen lidocaine-prilocaine (EMLA) cream Apply to affected area once 30 g 3  . lisinopril (PRINIVIL,ZESTRIL) 5 MG tablet Take 5 mg by mouth daily.   6  . metoprolol succinate (TOPROL-XL) 50 MG 24 hr tablet  TAKE 50 TABLET BY MOUTH TWICE DAILY    . ondansetron (ZOFRAN) 8 MG tablet Take 1 tablet (8 mg total) by mouth 2 (two) times daily as needed for refractory nausea / vomiting. 30 tablet 1  . prochlorperazine (COMPAZINE) 10 MG tablet TAKE 1 TABLET BY MOUTH EVERY 6 HOURS AS NEEDED FOR NAUSEA OR VOMITING 337 tablet 1  . ranitidine (ZANTAC) 150 MG tablet TAKE 1 TABLET(150 MG) BY MOUTH EVERY DAY    . simvastatin (ZOCOR) 20 MG tablet TAKE 1 TABLET(20 MG) BY MOUTH EVERY NIGHT     Current  Facility-Administered Medications  Medication Dose Route Frequency Provider Last Rate Last Dose  . ceFAZolin (ANCEF) IVPB 1 g/50 mL premix  1 g Intravenous Once Algernon Huxley, MD        OBJECTIVE: There were no vitals filed for this visit.   There is no height or weight on file to calculate BMI.    ECOG FS:1 - Symptomatic but completely ambulatory  General: Well-developed, well-nourished, no acute distress. Eyes: Pink conjunctiva, anicteric sclera. Lungs: Clear to auscultation bilaterally. Heart: Regular rate and rhythm. No rubs, murmurs, or gallops. Abdomen: Soft, nontender, nondistended. No organomegaly noted, normoactive bowel sounds. Musculoskeletal: No edema, cyanosis, or clubbing. Neuro: Alert, answering all questions appropriately. Cranial nerves grossly intact. Skin: No rashes or petechiae noted. Psych: Normal affect.   LAB RESULTS:  Lab Results  Component Value Date   NA 136 07/18/2016   K 3.7 07/18/2016   CL 97 (L) 07/18/2016   CO2 25 07/18/2016   GLUCOSE 132 (H) 07/18/2016   BUN 22 (H) 07/18/2016   CREATININE 1.10 07/18/2016   CALCIUM 8.2 (L) 07/18/2016   PROT 6.6 07/18/2016   ALBUMIN 3.1 (L) 07/18/2016   AST 51 (H) 07/18/2016   ALT 16 (L) 07/18/2016   ALKPHOS 155 (H) 07/18/2016   BILITOT 0.9 07/18/2016   GFRNONAA >60 07/18/2016   GFRAA >60 07/18/2016    Lab Results  Component Value Date   WBC 9.0 07/18/2016   NEUTROABS 8.1 (H) 07/18/2016   HGB 14.2 07/18/2016   HCT 43.5 07/18/2016   MCV 96.8 07/18/2016   PLT 56 (L) 07/18/2016   Lab Results  Component Value Date   CEA 151.4 (H) 07/07/2016   Lab Results  Component Value Date   CA199 636 (H) 07/07/2016     STUDIES: US Biopsy  Result Date: 07-18-16 CLINICAL DATA:  Colon carcinoma. Hepatic and pulmonary metastatic disease on PET-CT. EXAM: ULTRASOUND GUIDED CORE BIOPSY OF LIVER LESION MEDICATIONS: Intravenous Fentanyl and Versed were administered as conscious sedation during continuous monitoring  of the patient's level of consciousness and physiological / cardiorespiratory status by the radiology RN, with a total moderate sedation time of 10 minutes. PROCEDURE: The procedure, risks, benefits, and alternatives were explained to the patient. Questions regarding the procedure were encouraged and answered. The patient understands and consents to the procedure. Survey ultrasound of the liver was performed. A representative lesion in the right lobe was localized and appropriate skin entry site determined and marked. The operative field was prepped with chlorhexidine in a sterile fashion, and a sterile drape was applied covering the operative field. A sterile gown and sterile gloves were used for the procedure. Local anesthesia was provided with 1% Lidocaine. Under real-time ultrasound guidance, a 17 gauge trocar needle was advanced to the margin of the lesion. Once needle tip position was confirmed, coaxial 18-gauge core biopsy samples were obtained, submitted in formalin to surgical pathology. The guide needle was removed.  Postprocedure scans show no hemorrhage or other apparent complication. The patient tolerated the procedure well. COMPLICATIONS: None. FINDINGS: Multiple liver lesions were identified corresponding to findings on PET-CT. Representative ultrasound-guided core biopsy obtained as above. IMPRESSION: 1. Technically successful core biopsy of liver lesion. Electronically Signed   By: Lucrezia Europe M.D.   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 K-ras results are pending. No loss of nuclear expression of MMR proteins: Low probability of MSI-H.  Will not use Avastin initially pending any possible surgical intervention. PET scan results reviewed independently and reported as above with widespread metastatic disease in liver and  lungs. Both CEA and CA-19-9 are significantly elevated. Proceed with cycle 1 of palliative FOLFOX on Monday. Patient will have laboratory work and evaluation Fridays and then treatment the following Monday. Return to clinic in 2 weeks for further evaluation and consideration of cycle 2.   2. Hypertension: Patient's blood pressure is within normal limits today. Monitor.   Approximately 30 minutes was spent in discussion of which greater than 50% was consultation.  Patient expressed understanding and was in agreement with this plan. He also understands that He can call clinic at any time with any questions, concerns, or complaints.   Cancer Staging Metastatic colon cancer to liver [facility]) Staging form: Colon and Rectum, AJCC 8th Edition - Clinical stage from 06/28/2016: Stage IVB (cTX, cNX, pM1b) - Signed by Lloyd Huger, MD on 06/28/2016   Lloyd Huger, MD   07/19/2016 11:19 PM

## 2016-07-20 NOTE — Telephone Encounter (Signed)
[facility] is not covered by his insurance family would have to pay out of pocket. Patient has appointment Friday and will discuss with family the options then. Neoma Laming called 3/1 at 8:38 am callback number is [phone].

## 2016-07-21 ENCOUNTER — Inpatient Hospital Stay: Payer: Medicare HMO

## 2016-07-21 ENCOUNTER — Inpatient Hospital Stay: Payer: Medicare HMO | Admitting: Oncology

## 2016-07-21 NOTE — Telephone Encounter (Signed)
Patient will not be coming in today due to being admitted to [facility] for the flu. Waiting for patient to be discharged from [facility] to see if they set up home care for patient. If they do not set it up will discuss with the family other home care options since Elijah Chambers is out of network.

## 2016-07-21 NOTE — Telephone Encounter (Signed)
I see him today and will discuss options.

## 2016-07-24 ENCOUNTER — Inpatient Hospital Stay: Payer: Medicare HMO

## 2016-07-24 ENCOUNTER — Telehealth: Payer: Self-pay

## 2016-07-24 NOTE — Telephone Encounter (Signed)
Raya from [facility] called to let us know patient has been hospitalized. He has had the flu and pneumonia while hospitalized he also had low white counts. He wishes not to continue the chemotherapy. They are also going to set up hospice. They are going to talk with the family more today and see what the plan is. Her call back number is [phone]. She wanted to let us know what has been going on with the patient and see what your thoughts were on the care of the patient.

## 2016-07-24 NOTE — Telephone Encounter (Signed)
Hospice is appropriate.. I can be the physician of record if they need me to be.

## 2016-07-31 NOTE — Addendum Note (Signed)
Encounter addended by: Vernie Murders, RT on: 07/31/2016  9:44 AM<BR>    Actions taken: Imaging Exam ended, Charge Capture section accepted

## 2016-07-31 NOTE — Telephone Encounter (Signed)
Patient has passed away.

## 2016-08-20 DEATH — deceased

## 2017-04-25 ENCOUNTER — Other Ambulatory Visit: Payer: Self-pay | Admitting: Nurse Practitioner

## 2017-10-13 IMAGING — PT NM PET TUM IMG INITIAL (PI) SKULL BASE T - THIGH
1 of 10 series · 1 of 25 positions shown · non-contrast
Comparison: Chest CT 05/10/2016

CLINICAL DATA: Initial treatment strategy for right upper lobe lung
mass..

EXAM:
NUCLEAR MEDICINE PET SKULL BASE TO THIGH
TECHNIQUE: 12.25 mCi F-18 FDG was injected intravenously. Full-ring PET imaging
was performed from the skull base to thigh after the radiotracer. CT
data was obtained and used for attenuation correction and anatomic
localization.
FASTING BLOOD GLUCOSE:  Value: 77 mg/dl

[Series 3: ct wb 5.0 b30f · axial · 5.0mm · 0.98mm/px · 1 of 290 slices shown]
[im 290/290  brain]
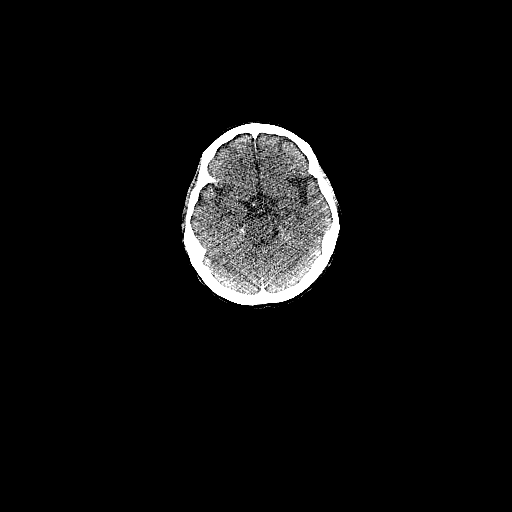

[1 of 25 positions shown; findings below may reference images not displayed]

FINDINGS: NECK

No hypermetabolic lymph nodes in the neck.

CHEST

Multiple pulmonary nodules are again demonstrated. The largest
lesion in the right upper lobe on image number 83 is hypermetabolic
with SUV max of 4.3. A smaller right upper lobe lesion on the same
image number measures 6.5 mm and is weakly hypermetabolic with SUV
max of 1.3. Left upper lobe lesion measuring 9 mm on image number
108 is weakly hypermetabolic with SUV max of 1.8. 6 mm right lower
lobe nodule in the as azygoesophageal recess is hypermetabolic with
SUV max 4.

There is a high right paraesophageal lesion which is not
hypermetabolic. This contains calcification and is most likely a
complicated esophageal duplication cyst. No enlarged or
hypermetabolic mediastinal or hilar lymph nodes.

ABDOMEN/PELVIS

Numerous hypermetabolic hepatic metastatic lesions. The largest
lesion is in segment 2 and measures 3 cm. SUV max is 13.0.

There is a markedly hypermetabolic sigmoid colon lesion with SUV max
of 14.7. There appears to be direct invasion of the sigmoid
mesocolon extending up into the retroperitoneum between the iliac
arteries. No pelvic sidewall or inguinal adenopathy.

SKELETON

No focal hypermetabolic activity to suggest skeletal metastasis.
IMPRESSION: 1. PET-CT findings most consistent with sigmoid colon neoplasm with
hepatic and pulmonary metastatic disease. There also appears to be
direct invasion of the sigmoid mesocolon extending up into the lower
retroperitoneum.
2. Right upper paraesophageal lesion is most likely a complex
esophageal duplication cyst. No hypermetabolism to suggest neoplasm.
3. Emphysematous changes and pulmonary scarring.
